# Patient Record
Sex: Male | Born: 1970 | Race: White | Hispanic: No | Marital: Married | Smoking: Never smoker
Health system: Southern US, Community
[De-identification: ages and names within clinical notes are randomized; demographics above are authoritative.]

## PROBLEM LIST (undated history)

## (undated) DIAGNOSIS — I10 Essential (primary) hypertension: Secondary | ICD-10-CM

## (undated) DIAGNOSIS — T7840XA Allergy, unspecified, initial encounter: Secondary | ICD-10-CM

## (undated) DIAGNOSIS — E785 Hyperlipidemia, unspecified: Secondary | ICD-10-CM

## (undated) DIAGNOSIS — K760 Fatty (change of) liver, not elsewhere classified: Secondary | ICD-10-CM

## (undated) HISTORY — DX: Essential (primary) hypertension: I10

## (undated) HISTORY — DX: Fatty (change of) liver, not elsewhere classified: K76.0

## (undated) HISTORY — DX: Allergy, unspecified, initial encounter: T78.40XA

## (undated) HISTORY — DX: Hyperlipidemia, unspecified: E78.5

---

## 2006-02-03 ENCOUNTER — Emergency Department (HOSPITAL_COMMUNITY): Admission: EM | Admit: 2006-02-03 | Discharge: 2006-02-03 | Payer: Self-pay | Admitting: Emergency Medicine

## 2012-10-01 ENCOUNTER — Other Ambulatory Visit: Payer: Self-pay | Admitting: Family Medicine

## 2012-10-01 ENCOUNTER — Other Ambulatory Visit (HOSPITAL_COMMUNITY): Payer: Self-pay | Admitting: Family Medicine

## 2012-10-01 DIAGNOSIS — R748 Abnormal levels of other serum enzymes: Secondary | ICD-10-CM

## 2012-10-03 ENCOUNTER — Other Ambulatory Visit (HOSPITAL_COMMUNITY): Payer: Self-pay

## 2012-10-05 ENCOUNTER — Ambulatory Visit (HOSPITAL_COMMUNITY): Payer: Self-pay

## 2012-10-05 ENCOUNTER — Ambulatory Visit (HOSPITAL_COMMUNITY)
Admission: RE | Admit: 2012-10-05 | Discharge: 2012-10-05 | Disposition: A | Discharge status: Home / Self Care | Payer: 59 | Source: Ambulatory Visit | Attending: Family Medicine | Admitting: Family Medicine

## 2012-10-05 DIAGNOSIS — R7989 Other specified abnormal findings of blood chemistry: Secondary | ICD-10-CM | POA: Insufficient documentation

## 2012-10-05 DIAGNOSIS — R748 Abnormal levels of other serum enzymes: Secondary | ICD-10-CM

## 2015-05-05 MED FILL — LOSARTAN POTASSIUM 50 MG TA: 50 | 90 days supply | Qty: 90 | Fill #0

## 2015-05-13 MED FILL — LEVOTHYROXINE 112 MCG TAB: 112 | 90 days supply | Qty: 90 | Fill #2

## 2015-07-01 DIAGNOSIS — J309 Allergic rhinitis, unspecified: Secondary | ICD-10-CM | POA: Diagnosis not present

## 2015-07-01 DIAGNOSIS — I10 Essential (primary) hypertension: Secondary | ICD-10-CM | POA: Diagnosis not present

## 2015-07-01 DIAGNOSIS — Z125 Encounter for screening for malignant neoplasm of prostate: Secondary | ICD-10-CM | POA: Diagnosis not present

## 2015-07-01 DIAGNOSIS — E782 Mixed hyperlipidemia: Secondary | ICD-10-CM | POA: Diagnosis not present

## 2015-07-01 DIAGNOSIS — E039 Hypothyroidism, unspecified: Secondary | ICD-10-CM | POA: Diagnosis not present

## 2015-07-01 MED FILL — LEVOTHYROXINE 125 MCG TAB: 125 | 90 days supply | Qty: 90 | Fill #0

## 2015-07-01 MED FILL — ATORVASTATIN 10 MG TABLET: 10 | 90 days supply | Qty: 90 | Fill #0

## 2015-07-15 MED FILL — FLUTICASONE PROP 50 MCG SPR: 50 | 60 days supply | Qty: 16 | Fill #0

## 2015-08-20 MED FILL — LOSARTAN POTASSIUM 50 MG TA: 50 | 90 days supply | Qty: 90 | Fill #0

## 2015-09-30 MED FILL — LEVOTHYROXINE 125 MCG TAB: 125 | 90 days supply | Qty: 90 | Fill #1

## 2015-09-30 MED FILL — ATORVASTATIN 10 MG TABLET: 10 | 90 days supply | Qty: 90 | Fill #1

## 2015-11-23 MED FILL — LOSARTAN POTASSIUM 50 MG TA: 50 | 90 days supply | Qty: 90 | Fill #1

## 2015-12-30 MED FILL — LEVOTHYROXINE 125 MCG TAB: 125 | 90 days supply | Qty: 90 | Fill #2

## 2015-12-30 MED FILL — ATORVASTATIN 10 MG TABLET: 10 | 90 days supply | Qty: 90 | Fill #2

## 2016-01-22 DIAGNOSIS — Z79899 Other long term (current) drug therapy: Secondary | ICD-10-CM | POA: Diagnosis not present

## 2016-01-22 DIAGNOSIS — E039 Hypothyroidism, unspecified: Secondary | ICD-10-CM | POA: Diagnosis not present

## 2016-01-22 DIAGNOSIS — E782 Mixed hyperlipidemia: Secondary | ICD-10-CM | POA: Diagnosis not present

## 2016-02-23 MED FILL — LOSARTAN POTASSIUM 50 MG TA: 50 | 90 days supply | Qty: 90 | Fill #2

## 2016-04-04 MED FILL — ATORVASTATIN 10 MG TABLET: 10 | 90 days supply | Qty: 90 | Fill #3

## 2016-04-04 MED FILL — LEVOTHYROXINE 125 MCG TABLE: 125 | 90 days supply | Qty: 90 | Fill #3

## 2016-05-27 MED FILL — LOSARTAN POTASSIUM 50 MG TA: 50 | 90 days supply | Qty: 90 | Fill #3

## 2016-07-06 MED FILL — LEVOTHYROXINE 125 MCG TABLE: 125 | 90 days supply | Qty: 90 | Fill #0

## 2016-07-06 MED FILL — ATORVASTATIN 10 MG TABLET: 10 | 90 days supply | Qty: 90 | Fill #0

## 2016-08-24 MED FILL — LOSARTAN POTASSIUM 50 MG TA: 50 | 90 days supply | Qty: 90 | Fill #0

## 2016-10-06 MED FILL — LEVOTHYROXINE 125 MCG TABLE: 125 | 90 days supply | Qty: 90 | Fill #1

## 2016-10-06 MED FILL — ATORVASTATIN 10 MG TABLET: 10 | 90 days supply | Qty: 90 | Fill #1

## 2016-11-22 MED FILL — LOSARTAN POTASSIUM 50 MG TA: 50 | 90 days supply | Qty: 90 | Fill #1

## 2017-01-05 DIAGNOSIS — J309 Allergic rhinitis, unspecified: Secondary | ICD-10-CM | POA: Diagnosis not present

## 2017-01-05 DIAGNOSIS — E782 Mixed hyperlipidemia: Secondary | ICD-10-CM | POA: Diagnosis not present

## 2017-01-05 DIAGNOSIS — Z23 Encounter for immunization: Secondary | ICD-10-CM | POA: Diagnosis not present

## 2017-01-05 DIAGNOSIS — E039 Hypothyroidism, unspecified: Secondary | ICD-10-CM | POA: Diagnosis not present

## 2017-01-05 DIAGNOSIS — Z125 Encounter for screening for malignant neoplasm of prostate: Secondary | ICD-10-CM | POA: Diagnosis not present

## 2017-01-05 DIAGNOSIS — I10 Essential (primary) hypertension: Secondary | ICD-10-CM | POA: Diagnosis not present

## 2017-01-05 DIAGNOSIS — R899 Unspecified abnormal finding in specimens from other organs, systems and tissues: Secondary | ICD-10-CM | POA: Diagnosis not present

## 2017-01-06 MED FILL — LEVOTHYROXINE 125 MCG TABLE: 125 | 90 days supply | Qty: 90 | Fill #0

## 2017-01-06 MED FILL — ATORVASTATIN 10 MG TABLET: 10 | 90 days supply | Qty: 90 | Fill #0

## 2017-01-30 MED FILL — LOSARTAN POTASSIUM 100 MG T: 100 | 90 days supply | Qty: 90 | Fill #0

## 2017-04-11 MED FILL — LEVOTHYROXINE 125 MCG TAB: 125 | 90 days supply | Qty: 90 | Fill #1

## 2017-04-11 MED FILL — ATORVASTATIN 10 MG TABLET: 10 | 90 days supply | Qty: 90 | Fill #1

## 2017-05-01 MED FILL — LOSARTAN POTASSIUM 100 MG T: 100 | 90 days supply | Qty: 90 | Fill #1

## 2017-07-08 ENCOUNTER — Ambulatory Visit: Payer: Self-pay | Admitting: Nurse Practitioner

## 2017-07-08 VITALS — BP 124/90 | HR 95 | Temp 98.8°F | Resp 20 | Wt 245.8 lb

## 2017-07-08 DIAGNOSIS — J309 Allergic rhinitis, unspecified: Secondary | ICD-10-CM

## 2017-07-08 DIAGNOSIS — J069 Acute upper respiratory infection, unspecified: Secondary | ICD-10-CM

## 2017-07-08 MED ORDER — BENZONATATE 100 MG PO CAPS: 100.0000 mg | ORAL_CAPSULE | Freq: Three times a day (TID) | ORAL | 0 refills | Status: AC | PRN

## 2017-07-08 MED ORDER — ALBUTEROL SULFATE HFA 108 (90 BASE) MCG/ACT IN AERS: 2.0000 | INHALATION_SPRAY | Freq: Four times a day (QID) | RESPIRATORY_TRACT | 0 refills | Status: DC | PRN

## 2017-07-08 NOTE — Patient Instructions (Signed)
Allergic Rhinitis, Adult Allergic rhinitis is an allergic reaction that affects the mucous membrane inside the nose. It causes sneezing, a runny or stuffy nose, and the feeling of mucus going down the back of the throat (postnasal drip). Allergic rhinitis can be mild to severe. There are two types of allergic rhinitis:  Seasonal. This type is also called hay fever. It happens only during certain seasons.  Perennial. This type can happen at any time of the year.  What are the causes? This condition happens when the body's defense system (immune system) responds to certain harmless substances called allergens as though they were germs.  Seasonal allergic rhinitis is triggered by pollen, which can come from grasses, trees, and weeds. Perennial allergic rhinitis may be caused by:  House dust mites.  Pet dander.  Mold spores.  What are the signs or symptoms? Symptoms of this condition include:  Sneezing.  Runny or stuffy nose (nasal congestion).  Postnasal drip.  Itchy nose.  Tearing of the eyes.  Trouble sleeping.  Daytime sleepiness.  How is this diagnosed? This condition may be diagnosed based on:  Your medical history.  A physical exam.  Tests to check for related conditions, such as: ? Asthma. ? Pink eye. ? Ear infection. ? Upper respiratory infection.  Tests to find out which allergens trigger your symptoms. These may include skin or blood tests.  How is this treated? There is no cure for this condition, but treatment can help control symptoms. Treatment may include:  Taking medicines that block allergy symptoms, such as antihistamines. Medicine may be given as a shot, nasal spray, or pill.  Avoiding the allergen.  Desensitization. This treatment involves getting ongoing shots until your body becomes less sensitive to the allergen. This treatment may be done if other treatments do not help.  If taking medicine and avoiding the allergen does not work, new,  stronger medicines may be prescribed.  Follow these instructions at home:  Find out what you are allergic to. Common allergens include smoke, dust, and pollen.  Avoid the things you are allergic to. These are some things you can do to help avoid allergens: ? Replace carpet with wood, tile, or vinyl flooring. Carpet can trap dander and dust. ? Do not smoke. Do not allow smoking in your home. ? Change your heating and air conditioning filter at least once a month. ? During allergy season:  Keep windows closed as much as possible.  Plan outdoor activities when pollen counts are lowest. This is usually during the evening hours.  When coming indoors, change clothing and shower before sitting on furniture or bedding.  Take over-the-counter and prescription medicines only as told by your health care provider.  Keep all follow-up visits as told by your health care provider. This is important. Contact a health care provider if:  You have a fever.  You develop a persistent cough.  You make whistling sounds when you breathe (you wheeze).  Your symptoms interfere with your normal daily activities. Get help right away if:  You have shortness of breath. Summary  This condition can be managed by taking medicines as directed and avoiding allergens.  Contact your health care provider if you develop a persistent cough or fever.  During allergy season, keep windows closed as much as possible. This information is not intended to replace advice given to you by your health care provider. Make sure you discuss any questions you have with your health care provider. Document Released: 12/07/2000 Document Revised: 04/21/2016  Document Reviewed: 04/21/2016 Elsevier Interactive Patient Education  2018 [employer].  Viral Respiratory Infection A respiratory infection is an illness that affects part of the respiratory system, such as the lungs, nose, or throat. Most respiratory infections are caused  by either viruses or bacteria. A respiratory infection that is caused by a virus is called a viral respiratory infection. Common types of viral respiratory infections include:  A cold.  The flu (influenza).  A respiratory syncytial virus (RSV) infection.  How do I know if I have a viral respiratory infection? Most viral respiratory infections cause:  A stuffy or runny nose.  Yellow or green nasal discharge.  A cough.  Sneezing.  Fatigue.  Achy muscles.  A sore throat.  Sweating or chills.  A fever.  A headache.  How are viral respiratory infections treated? If influenza is diagnosed early, it may be treated with an antiviral medicine that shortens the length of time a person has symptoms. Symptoms of viral respiratory infections may be treated with over-the-counter and prescription medicines, such as:  Expectorants. These make it easier to cough up mucus.  Decongestant nasal sprays.  Health care providers do not prescribe antibiotic medicines for viral infections. This is because antibiotics are designed to kill bacteria. They have no effect on viruses. How do I know if I should stay home from work or school? To avoid exposing others to your respiratory infection, stay home if you have:  A fever.  A persistent cough.  A sore throat.  A runny nose.  Sneezing.  Muscles aches.  Headaches.  Fatigue.  Weakness.  Chills.  Sweating.  Nausea.  Follow these instructions at home:  Rest as much as possible.  Take over-the-counter and prescription medicines only as told by your health care provider.  Drink enough fluid to keep your urine clear or pale yellow. This helps prevent dehydration and helps loosen up mucus.  Gargle with a salt-water mixture 3-4 times per day or as needed. To make a salt-water mixture, completely dissolve -1 tsp of salt in 1 cup of warm water.  Use nose drops made from salt water to ease congestion and soften raw skin around  your nose.  Do not drink alcohol.  Do not use tobacco products, including cigarettes, chewing tobacco, and e-cigarettes. If you need help quitting, ask your health care provider. Contact a health care provider if:  Your symptoms last for 10 days or longer.  Your symptoms get worse over time.  You have a fever.  You have severe sinus pain in your face or forehead.  The glands in your jaw or neck become very swollen. Get help right away if:  You feel pain or pressure in your chest.  You have shortness of breath.  You faint or feel like you will faint.  You have severe and persistent vomiting.  You feel confused or disoriented. This information is not intended to replace advice given to you by your health care provider. Make sure you discuss any questions you have with your health care provider. Document Released: 12/22/2004 Document Revised: 08/20/2015 Document Reviewed: 08/20/2014 Elsevier Interactive Patient Education  [employer].

## 2017-07-08 NOTE — Progress Notes (Addendum)
Subjective:  Brett Booth is a 47 y.o. male who presents for evaluation of URI like symptoms.  Symptoms include nasal congestion, no fever, post nasal drip and productive cough with yellow colored sputum.  Onset of symptoms was 2 days ago, and has been unchanged since that time.  Treatment to date:  antihistamines and naproxen.  High risk factors for influenza complications:  co-morbid illness.  The following portions of the patient's history were reviewed and updated as appropriate:  allergies, current medications and past medical history.  Constitutional: positive for fatigue, negative for anorexia, chills, fevers and sweats Eyes: negative Ears, nose, mouth, throat, and face: positive for scratchy throat and post-nasal drip, negative for ear drainage, earaches and sore throat Respiratory: positive for cough, dyspnea on exertion, sputum and wheezing, negative for asthma, chronic bronchitis, dyspnea on exertion, pneumonia and stridor Cardiovascular: negative Gastrointestinal: negative Neurological: negative Allergic/Immunologic: positive for hay fever Objective:  BP 124/90 (BP Location: Right Arm, Patient Position: Sitting, Cuff Size: Normal)   Pulse 95   Temp 98.8 F (37.1 C) (Oral)   Resp 20   Wt 245 lb 12.8 oz (111.5 kg)   SpO2 98%  General appearance: alert, cooperative, fatigued and no distress Head: Normocephalic, without obvious abnormality, atraumatic Eyes: conjunctivae/corneas clear. PERRL, EOM's intact. Fundi benign. Ears: normal TM's and external ear canals both ears Nose: no discharge, turbinates swollen, inflamed, no sinus tenderness Throat: lips, mucosa, and tongue normal; teeth and gums normal Lungs: clear to auscultation bilaterally Heart: regular rate and rhythm, S1, S2 normal, no murmur, click, rub or gallop Abdomen: soft, non-tender; bowel sounds normal; no masses,  no organomegaly Pulses: 2+ and symmetric Skin: Skin color, texture, turgor normal. No rashes or  lesions Lymph nodes: cervical and submandibular nodes normal Neurologic: Grossly normal    Assessment:  viral upper respiratory illness    Plan:  Discussed diagnosis and treatment of URI. Discussed the importance of avoiding unnecessary antibiotic therapy. Educational material distributed and questions answered. Suggested symptomatic OTC remedies. Supportive care with appropriate antipyretics and fluids. Nasal saline spray for congestion. Tessalon Perles per orders. Nasal steroids per orders. Follow up as needed.  Patient instructed to sleep on 2 pillows and to increase fluids.  Ibuprofen or Tylenol for pain fever or general discomfort.  Also discussed use of humidifier whether warm or cool humidified air would be helpful for cough at night.  Patient also advised to purchase over-the-counter Coricidin HBP for cough.  Patient verbalizes understanding and has no questions at time of discharge. Meds ordered this encounter  Medications  . benzonatate (TESSALON) 100 MG capsule    Sig: Take 1 capsule (100 mg total) by mouth 3 (three) times daily as needed for up to 10 days for cough.    Dispense:  30 capsule    Refill:  0    Order Specific Question:   Supervising Provider    Answer:   Ricard Dillon [[id number]]  . albuterol (PROVENTIL HFA;VENTOLIN HFA) 108 (90 Base) MCG/ACT inhaler    Sig: Inhale 2 puffs into the lungs every 6 (six) hours as needed for up to 10 days for wheezing or shortness of breath.    Dispense:  1 Inhaler    Refill:  0    Order Specific Question:   Supervising Provider    Answer:   Ricard Dillon [phone]

## 2017-07-11 MED FILL — LEVOTHYROXINE 125 MCG TAB: 125 | 90 days supply | Qty: 90 | Fill #2

## 2017-07-11 MED FILL — ATORVASTATIN 10 MG TABLET: 10 | 90 days supply | Qty: 90 | Fill #2

## 2017-07-19 ENCOUNTER — Telehealth: Payer: Self-pay

## 2017-07-31 MED FILL — LOSARTAN POTASSIUM 100 MG T: 100 | 90 days supply | Qty: 90 | Fill #2

## 2017-10-06 MED FILL — ATORVASTATIN 10 MG TABLET: 10 | 90 days supply | Qty: 90 | Fill #3

## 2017-10-06 MED FILL — LEVOTHYROXINE 125 MCG TAB: 125 | 90 days supply | Qty: 90 | Fill #3

## 2017-10-19 MED FILL — LOSARTAN POTASSIUM 100 MG T: 100 | 90 days supply | Qty: 90 | Fill #3

## 2017-11-15 MED FILL — FLUTICASONE PROP 50 MCG SPR: 50 | 30 days supply | Qty: 16 | Fill #0

## 2017-12-21 MED FILL — FLUTICASONE PROP 50 MCG SPR: 50 | 30 days supply | Qty: 16 | Fill #1

## 2018-01-04 MED FILL — LEVOTHYROXINE 125 MCG TAB: 125 | 90 days supply | Qty: 90 | Fill #4

## 2018-01-04 MED FILL — ATORVASTATIN 10 MG TABLET: 10 | 90 days supply | Qty: 90 | Fill #4

## 2018-01-04 MED FILL — LOSARTAN POTASSIUM 100 MG T: 100 | 90 days supply | Qty: 90 | Fill #4

## 2018-04-11 DIAGNOSIS — Z125 Encounter for screening for malignant neoplasm of prostate: Secondary | ICD-10-CM | POA: Diagnosis not present

## 2018-04-11 DIAGNOSIS — I10 Essential (primary) hypertension: Secondary | ICD-10-CM | POA: Diagnosis not present

## 2018-04-11 DIAGNOSIS — K760 Fatty (change of) liver, not elsewhere classified: Secondary | ICD-10-CM | POA: Diagnosis not present

## 2018-04-11 DIAGNOSIS — Z0184 Encounter for antibody response examination: Secondary | ICD-10-CM | POA: Diagnosis not present

## 2018-04-11 DIAGNOSIS — E782 Mixed hyperlipidemia: Secondary | ICD-10-CM | POA: Diagnosis not present

## 2018-04-11 DIAGNOSIS — Z6836 Body mass index (BMI) 36.0-36.9, adult: Secondary | ICD-10-CM | POA: Diagnosis not present

## 2018-04-11 DIAGNOSIS — E039 Hypothyroidism, unspecified: Secondary | ICD-10-CM | POA: Diagnosis not present

## 2018-04-11 MED FILL — AMLODIPINE BESYLATE 5 MG TA: 5 | 90 days supply | Qty: 90 | Fill #0

## 2018-04-12 MED FILL — ATORVASTATIN 10 MG TABLET: 10 | 90 days supply | Qty: 90 | Fill #0

## 2018-04-12 MED FILL — LEVOTHYROXINE 125 MCG TAB: 125 | 90 days supply | Qty: 90 | Fill #0

## 2018-04-12 MED FILL — LOSARTAN POTASSIUM 100 MG T: 100 | 90 days supply | Qty: 90 | Fill #0

## 2018-06-08 MED FILL — FLUTICASONE PROP 50 MCG SPR: 50 | 30 days supply | Qty: 16 | Fill #0

## 2018-06-14 MED FILL — ATORVASTATIN 10 MG TABLET: 10 | 90 days supply | Qty: 90 | Fill #1

## 2018-06-14 MED FILL — LEVOTHYROXINE 125 MCG TAB: 125 | 90 days supply | Qty: 90 | Fill #1

## 2018-06-14 MED FILL — AMLODIPINE BESYLATE 5 MG TA: 5 | 90 days supply | Qty: 90 | Fill #1

## 2018-06-14 MED FILL — LOSARTAN POTASSIUM 100 MG T: 100 | 90 days supply | Qty: 90 | Fill #1

## 2018-08-30 MED FILL — FLUTICASONE PROP 50 MCG SPR: 50 | 30 days supply | Qty: 16 | Fill #1

## 2018-10-12 MED FILL — ATORVASTATIN 10 MG TABLET: 10 | 90 days supply | Qty: 90 | Fill #2

## 2018-10-12 MED FILL — LOSARTAN POTASSIUM 100 MG T: 100 | 90 days supply | Qty: 90 | Fill #2

## 2018-10-12 MED FILL — LEVOTHYROXINE 125 MCG TAB: 125 | 90 days supply | Qty: 90 | Fill #2

## 2018-10-12 MED FILL — AMLODIPINE BESYLATE 5 MG TA: 5 | 90 days supply | Qty: 90 | Fill #2

## 2019-01-11 MED FILL — LOSARTAN POTASSIUM 100 MG T: 100 | 90 days supply | Qty: 90 | Fill #3

## 2019-01-11 MED FILL — AMLODIPINE BESYLATE 5 MG TA: 5 | 90 days supply | Qty: 90 | Fill #3

## 2019-01-11 MED FILL — LEVOTHYROXINE 125 MCG TAB: 125 | 90 days supply | Qty: 90 | Fill #3

## 2019-01-11 MED FILL — ATORVASTATIN 10 MG TABLET: 10 | 90 days supply | Qty: 90 | Fill #3

## 2019-04-10 MED FILL — LEVOTHYROXINE SODIUM 125 MC: 125 | 90 days supply | Qty: 90 | Fill #4

## 2019-04-10 MED FILL — ATORVASTATIN 10 MG TABLET: 10 | 90 days supply | Qty: 90 | Fill #4

## 2019-04-10 MED FILL — LOSARTAN POTASSIUM 100 MG T: 100 | 90 days supply | Qty: 90 | Fill #4

## 2019-04-10 MED FILL — AMLODIPINE BESYLATE 5 MG TA: 5 | 30 days supply | Qty: 30 | Fill #4

## 2019-04-24 DIAGNOSIS — E782 Mixed hyperlipidemia: Secondary | ICD-10-CM | POA: Diagnosis not present

## 2019-04-24 DIAGNOSIS — I10 Essential (primary) hypertension: Secondary | ICD-10-CM | POA: Diagnosis not present

## 2019-04-24 DIAGNOSIS — E039 Hypothyroidism, unspecified: Secondary | ICD-10-CM | POA: Diagnosis not present

## 2019-04-24 DIAGNOSIS — K760 Fatty (change of) liver, not elsewhere classified: Secondary | ICD-10-CM | POA: Diagnosis not present

## 2019-04-24 DIAGNOSIS — Z6835 Body mass index (BMI) 35.0-35.9, adult: Secondary | ICD-10-CM | POA: Diagnosis not present

## 2019-04-24 DIAGNOSIS — Z125 Encounter for screening for malignant neoplasm of prostate: Secondary | ICD-10-CM | POA: Diagnosis not present

## 2019-04-24 DIAGNOSIS — R7301 Impaired fasting glucose: Secondary | ICD-10-CM | POA: Diagnosis not present

## 2019-04-24 DIAGNOSIS — Z1211 Encounter for screening for malignant neoplasm of colon: Secondary | ICD-10-CM | POA: Diagnosis not present

## 2019-05-06 MED FILL — AMLODIPINE BESYLATE 5 MG TA: 5 | 30 days supply | Qty: 30 | Fill #0

## 2019-05-07 NOTE — Telephone Encounter (Signed)
Error

## 2019-06-04 MED FILL — FLUTICASONE PROP 50 MCG SPR: 50 | 30 days supply | Qty: 16 | Fill #0

## 2019-06-10 MED FILL — AMLODIPINE BESYLATE 5 MG TA: 5 | 30 days supply | Qty: 30 | Fill #1

## 2019-06-21 DIAGNOSIS — R7301 Impaired fasting glucose: Secondary | ICD-10-CM | POA: Diagnosis not present

## 2019-06-21 DIAGNOSIS — Z125 Encounter for screening for malignant neoplasm of prostate: Secondary | ICD-10-CM | POA: Diagnosis not present

## 2019-06-21 DIAGNOSIS — E039 Hypothyroidism, unspecified: Secondary | ICD-10-CM | POA: Diagnosis not present

## 2019-06-21 DIAGNOSIS — E782 Mixed hyperlipidemia: Secondary | ICD-10-CM | POA: Diagnosis not present

## 2019-06-21 DIAGNOSIS — K760 Fatty (change of) liver, not elsewhere classified: Secondary | ICD-10-CM | POA: Diagnosis not present

## 2019-06-21 DIAGNOSIS — Z1211 Encounter for screening for malignant neoplasm of colon: Secondary | ICD-10-CM | POA: Diagnosis not present

## 2019-06-21 DIAGNOSIS — I10 Essential (primary) hypertension: Secondary | ICD-10-CM | POA: Diagnosis not present

## 2019-07-10 ENCOUNTER — Other Ambulatory Visit (HOSPITAL_COMMUNITY): Payer: Self-pay | Admitting: Family Medicine

## 2019-07-10 MED FILL — LEVOTHYROXINE SODIUM 125 MC: 125 | 90 days supply | Qty: 90 | Fill #0

## 2019-07-10 MED FILL — ATORVASTATIN 10 MG TABLET: 10 | 90 days supply | Qty: 90 | Fill #0

## 2019-07-10 MED FILL — LOSARTAN POTASSIUM 100 MG T: 100 | 90 days supply | Qty: 90 | Fill #0

## 2019-07-10 MED FILL — AMLODIPINE BESYLATE 5 MG TA: 5 | 30 days supply | Qty: 30 | Fill #2

## 2019-07-10 MED FILL — FLUTICASONE PROP 50 MCG SPR: 50 | 30 days supply | Qty: 16 | Fill #0

## 2019-07-11 DIAGNOSIS — E782 Mixed hyperlipidemia: Secondary | ICD-10-CM | POA: Diagnosis not present

## 2019-07-11 DIAGNOSIS — Z0000 Encounter for general adult medical examination without abnormal findings: Secondary | ICD-10-CM | POA: Diagnosis not present

## 2019-07-11 DIAGNOSIS — R7301 Impaired fasting glucose: Secondary | ICD-10-CM | POA: Diagnosis not present

## 2019-07-11 DIAGNOSIS — I10 Essential (primary) hypertension: Secondary | ICD-10-CM | POA: Diagnosis not present

## 2019-07-11 DIAGNOSIS — J309 Allergic rhinitis, unspecified: Secondary | ICD-10-CM | POA: Diagnosis not present

## 2019-07-11 DIAGNOSIS — Z1211 Encounter for screening for malignant neoplasm of colon: Secondary | ICD-10-CM | POA: Diagnosis not present

## 2019-07-11 DIAGNOSIS — E039 Hypothyroidism, unspecified: Secondary | ICD-10-CM | POA: Diagnosis not present

## 2019-07-11 DIAGNOSIS — K760 Fatty (change of) liver, not elsewhere classified: Secondary | ICD-10-CM | POA: Diagnosis not present

## 2019-07-12 ENCOUNTER — Other Ambulatory Visit: Payer: Self-pay | Admitting: Family Medicine

## 2019-07-12 DIAGNOSIS — K760 Fatty (change of) liver, not elsewhere classified: Secondary | ICD-10-CM

## 2019-08-02 ENCOUNTER — Ambulatory Visit
Admission: RE | Admit: 2019-08-02 | Discharge: 2019-08-02 | Disposition: A | Discharge status: Home / Self Care | Payer: 59 | Source: Ambulatory Visit | Attending: Family Medicine | Admitting: Family Medicine

## 2019-08-02 DIAGNOSIS — K760 Fatty (change of) liver, not elsewhere classified: Secondary | ICD-10-CM

## 2019-08-12 MED FILL — AMLODIPINE BESYLATE 5 MG TA: 5 | 30 days supply | Qty: 30 | Fill #0

## 2019-09-10 MED FILL — AMLODIPINE BESYLATE 5 MG TA: 5 | 30 days supply | Qty: 30 | Fill #0

## 2019-09-10 MED FILL — FLUTICASONE PROP 50 MCG SPR: 50 | 30 days supply | Qty: 16 | Fill #0

## 2019-10-04 MED FILL — AMLODIPINE BESYLATE 5 MG TA: 5 | 30 days supply | Qty: 30 | Fill #0

## 2019-10-14 MED FILL — LOSARTAN POTASSIUM 100 MG T: 100 | 90 days supply | Qty: 90 | Fill #1

## 2019-10-14 MED FILL — ATORVASTATIN CALCIUM 10 MG: 10 | 90 days supply | Qty: 90 | Fill #1

## 2019-10-14 MED FILL — LEVOTHYROXINE SODIUM 125 MC: 125 | 90 days supply | Qty: 90 | Fill #1

## 2019-10-16 ENCOUNTER — Other Ambulatory Visit (HOSPITAL_COMMUNITY): Payer: Self-pay | Admitting: Family Medicine

## 2019-10-16 MED FILL — AMLODIPINE BESYLATE 5 MG TA: 5 | 90 days supply | Qty: 90 | Fill #0

## 2020-01-07 MED FILL — LOSARTAN POTASSIUM 100 MG T: 100 | 90 days supply | Qty: 90 | Fill #2

## 2020-01-07 MED FILL — LEVOTHYROXINE SODIUM 125 MC: 125 | 90 days supply | Qty: 90 | Fill #2

## 2020-01-07 MED FILL — AMLODIPINE BESYLATE 5 MG TA: 5 | 90 days supply | Qty: 90 | Fill #1

## 2020-01-07 MED FILL — ATORVASTATIN CALCIUM 10 MG: 10 | 90 days supply | Qty: 90 | Fill #2

## 2020-03-05 ENCOUNTER — Other Ambulatory Visit (HOSPITAL_COMMUNITY): Payer: Self-pay | Admitting: Family Medicine

## 2020-03-05 MED FILL — FLUTICASONE PROP 50 MCG SPR: 50 | 30 days supply | Qty: 16 | Fill #0

## 2020-03-06 DIAGNOSIS — K760 Fatty (change of) liver, not elsewhere classified: Secondary | ICD-10-CM | POA: Diagnosis not present

## 2020-04-13 MED FILL — LOSARTAN POTASSIUM 100 MG T: 100 | 30 days supply | Qty: 30 | Fill #3

## 2020-04-13 MED FILL — ATORVASTATIN CALCIUM 10 MG: 10 | 90 days supply | Qty: 90 | Fill #3

## 2020-04-13 MED FILL — AMLODIPINE BESYLATE 5 MG TA: 5 | 90 days supply | Qty: 90 | Fill #2

## 2020-04-13 MED FILL — LEVOTHYROXINE SODIUM 125 MC: 125 | 90 days supply | Qty: 90 | Fill #3

## 2020-05-25 MED FILL — LOSARTAN POTASSIUM 100 MG T: 100 | 30 days supply | Qty: 30 | Fill #4

## 2020-06-29 ENCOUNTER — Other Ambulatory Visit: Payer: Self-pay

## 2020-06-30 ENCOUNTER — Other Ambulatory Visit (HOSPITAL_COMMUNITY): Payer: Self-pay

## 2020-06-30 MED FILL — Losartan Potassium Tab 100 MG: ORAL | 30 days supply | Qty: 30 | Fill #0 | Status: AC

## 2020-07-03 ENCOUNTER — Other Ambulatory Visit (HOSPITAL_BASED_OUTPATIENT_CLINIC_OR_DEPARTMENT_OTHER)
Admission: RE | Admit: 2020-07-03 | Discharge: 2020-07-03 | Disposition: A | Discharge status: Home / Self Care | Payer: 59 | Source: Ambulatory Visit | Attending: Family Medicine | Admitting: Family Medicine

## 2020-07-03 ENCOUNTER — Other Ambulatory Visit (HOSPITAL_COMMUNITY): Payer: Self-pay

## 2020-07-03 ENCOUNTER — Ambulatory Visit (HOSPITAL_BASED_OUTPATIENT_CLINIC_OR_DEPARTMENT_OTHER): Payer: 59 | Admitting: Family Medicine

## 2020-07-03 ENCOUNTER — Other Ambulatory Visit: Payer: Self-pay

## 2020-07-03 ENCOUNTER — Encounter (HOSPITAL_BASED_OUTPATIENT_CLINIC_OR_DEPARTMENT_OTHER): Payer: Self-pay | Admitting: Family Medicine

## 2020-07-03 ENCOUNTER — Other Ambulatory Visit (HOSPITAL_BASED_OUTPATIENT_CLINIC_OR_DEPARTMENT_OTHER): Payer: Self-pay

## 2020-07-03 ENCOUNTER — Telehealth (HOSPITAL_BASED_OUTPATIENT_CLINIC_OR_DEPARTMENT_OTHER): Payer: Self-pay | Admitting: Family Medicine

## 2020-07-03 DIAGNOSIS — J302 Other seasonal allergic rhinitis: Secondary | ICD-10-CM | POA: Diagnosis not present

## 2020-07-03 DIAGNOSIS — I10 Essential (primary) hypertension: Secondary | ICD-10-CM | POA: Insufficient documentation

## 2020-07-03 DIAGNOSIS — E039 Hypothyroidism, unspecified: Secondary | ICD-10-CM | POA: Diagnosis not present

## 2020-07-03 DIAGNOSIS — E785 Hyperlipidemia, unspecified: Secondary | ICD-10-CM | POA: Diagnosis not present

## 2020-07-03 DIAGNOSIS — Z0000 Encounter for general adult medical examination without abnormal findings: Secondary | ICD-10-CM | POA: Diagnosis not present

## 2020-07-03 DIAGNOSIS — E669 Obesity, unspecified: Secondary | ICD-10-CM

## 2020-07-03 LAB — COMPREHENSIVE METABOLIC PANEL
ALT: 36 U/L (ref 0–44)
AST: 28 U/L (ref 15–41)
Albumin: 4.9 g/dL (ref 3.5–5.0)
Alkaline Phosphatase: 43 U/L (ref 38–126)
Anion gap: 11 (ref 5–15)
BUN: 14 mg/dL (ref 6–20)
CO2: 24 mmol/L (ref 22–32)
Calcium: 10.2 mg/dL (ref 8.9–10.3)
Chloride: 104 mmol/L (ref 98–111)
Creatinine, Ser: 1.07 mg/dL (ref 0.61–1.24)
GFR, Estimated: >60 mL/min (ref >60)
Glucose, Bld: 98 mg/dL (ref 70–99)
Potassium: 4 mmol/L (ref 3.5–5.1)
Sodium: 139 mmol/L (ref 135–145)
Total Bilirubin: 1.1 mg/dL (ref 0.3–1.2)
Total Protein: 8 g/dL (ref 6.5–8.1)

## 2020-07-03 LAB — CBC WITH DIFFERENTIAL/PLATELET
Abs Immature Granulocytes: 0.03 10*3/uL (ref 0.00–0.07)
Basophils Absolute: 0.1 10*3/uL (ref 0.0–0.1)
Basophils Relative: 1 %
Eosinophils Absolute: 0.1 10*3/uL (ref 0.0–0.5)
Eosinophils Relative: 1 %
HCT: 48.3 % (ref 39.0–52.0)
Hemoglobin: 16.7 g/dL (ref 13.0–17.0)
Immature Granulocytes: 0 %
Lymphocytes Relative: 30 %
Lymphs Abs: 2.1 10*3/uL (ref 0.7–4.0)
MCH: 33.8 pg (ref 26.0–34.0)
MCHC: 34.6 g/dL (ref 30.0–36.0)
MCV: 97.8 fL (ref 80.0–100.0)
Monocytes Absolute: 0.8 10*3/uL (ref 0.1–1.0)
Monocytes Relative: 11 %
Neutro Abs: 4.1 10*3/uL (ref 1.7–7.7)
Neutrophils Relative %: 57 %
Platelets: 250 10*3/uL (ref 150–400)
RBC: 4.94 MIL/uL (ref 4.22–5.81)
RDW: 12.4 % (ref 11.5–15.5)
WBC: 7.2 10*3/uL (ref 4.0–10.5)
nRBC: 0 % (ref 0.0–0.2)

## 2020-07-03 LAB — LIPID PANEL
Cholesterol: 186 mg/dL (ref 0–200)
HDL: 56 mg/dL (ref >40)
LDL Cholesterol: 113 mg/dL — ABNORMAL HIGH (ref 0–99)
Total CHOL/HDL Ratio: 3.3 RATIO
Triglycerides: 85 mg/dL (ref <150)
VLDL: 17 mg/dL (ref 0–40)

## 2020-07-03 LAB — TSH: TSH: 14.407 u[IU]/mL — ABNORMAL HIGH (ref 0.350–4.500)

## 2020-07-03 MED ORDER — FLUTICASONE PROPIONATE 50 MCG/ACT NA SUSP
NASAL | 0 refills | Status: DC
  Filled 2020-07-03 (×2): qty 16, 30d supply, fill #0

## 2020-07-03 MED ORDER — LOSARTAN POTASSIUM 100 MG PO TABS
100.0000 mg | ORAL_TABLET | Freq: Every day | ORAL | 3 refills | Status: DC
  Filled 2020-07-03 – 2020-07-23 (×2): qty 90, 90d supply, fill #0
  Filled 2020-10-05: qty 90, 90d supply, fill #1
  Filled 2021-01-06: qty 90, 90d supply, fill #2
  Filled 2021-04-11: qty 90, 90d supply, fill #3

## 2020-07-03 MED ORDER — AMLODIPINE BESYLATE 5 MG PO TABS
5.0000 mg | ORAL_TABLET | Freq: Every day | ORAL | 2 refills | Status: DC
  Filled 2020-07-03 (×2): qty 90, 90d supply, fill #0
  Filled 2020-10-05: qty 90, 90d supply, fill #1
  Filled 2021-01-06: qty 90, 90d supply, fill #2

## 2020-07-03 MED ORDER — ATORVASTATIN CALCIUM 10 MG PO TABS
10.0000 mg | ORAL_TABLET | Freq: Every day | ORAL | 3 refills | Status: DC
  Filled 2020-07-03 (×2): qty 90, 90d supply, fill #0
  Filled 2020-10-05: qty 90, 90d supply, fill #1
  Filled 2021-01-06: qty 90, 90d supply, fill #2
  Filled 2021-04-11: qty 90, 90d supply, fill #3

## 2020-07-03 MED ORDER — LEVOTHYROXINE SODIUM 125 MCG PO TABS
125.0000 ug | ORAL_TABLET | Freq: Every day | ORAL | 3 refills | Status: DC
  Filled 2020-07-03 (×2): qty 90, 90d supply, fill #0

## 2020-07-03 NOTE — Patient Instructions (Signed)
  Medication Instructions:  Your physician recommends that you continue on your current medications as directed. Please refer to the Current Medication list given to you today. --If you need a refill on any your medications before your next appointment, please call your pharmacy first. If no refills are authorized on file call the office.--  Lab Work: Your physician has recommended that you have lab work today: CBC, Comprehensive Metabolic Panel, Lipid Panel, and Thyroid Panel If you have labs (blood work) drawn today and your tests are completely normal, you will receive your results only by: Marland Kitchen MyChart Message (if you have MyChart) OR . A phone call from our staff. Please ensure you check your voicemail in the event that you authorized detailed messages to be left on a delegated number. If you have any lab test that is abnormal or we need to change your treatment, we will call you to review the results.  Follow-Up: Your next appointment:   Your physician recommends that you schedule a follow-up appointment in: 6 MONTHS with Dr. De [place]  Thanks for letting us be apart of your health journey!!  Primary Care and Sports Medicine   Dr. de [place] and Worthy Keeler, DNP, AGNP  We recommend signing up for the patient portal called "MyChart".  Sign up information is provided on this After Visit Summary.  MyChart is used to connect with patients for Virtual Visits (Telemedicine).  Patients are able to view lab/test results, encounter notes, upcoming appointments, etc.  Non-urgent messages can be sent to your provider as well.   To learn more about what you can do with MyChart, go to [email or web address].

## 2020-07-03 NOTE — Assessment & Plan Note (Signed)
Chronic, at goal per home readings, borderline in office today Recommend continuing with current dosages of amlodipine and losartan Patient has had intentional weight loss over the past year which is helpful in regards to lowering his BMI, encouraged to continue with lifestyle modifications, avoiding extra salt in the diet Continue to monitor blood pressures at home Consider adjustments in medication if having symptomatic low blood pressure, particularly if patient continues to lose weight Check labs as below

## 2020-07-03 NOTE — Assessment & Plan Note (Signed)
Chronic, at goal No overt symptoms of hypo or hyperthyroidism Continue with current dosage of medication We will check TSH and adjust dose as needed

## 2020-07-03 NOTE — Progress Notes (Signed)
New Patient Office Visit  Subjective:  Patient ID: Brett Booth, male    DOB: 12/10/1970  Age: 50 y.o. MRN: [id number]  CC:  Chief Complaint  Patient presents with  . Establish Care  . Medication Refill    HPI Brett Booth is an [age] year old male presenting to establish in clinic.  No specific concerns today.  Reports past medical history of hypertension, hypothyroidism, hyperlipidemia, seasonal allergies.  Hypertension: Reports he does check his blood pressure at home somewhat regularly, blood pressure typically 191Y systolic and 78G diastolic.  Denies any issues with chest pain, frequent headaches, shortness of breath.  Will occasionally have some dizziness at times.  Has not checked blood pressure during these episodes of dizziness.  Currently taking amlodipine and losartan, reports being on his medications for at least a few years now.  Hypothyroidism presently taking 125 mcg dose.  No recent dose adjustments for this.  Denies any issues related to temperature intolerance, palpitations.  Hyperlipidemia: Being managed on atorvastatin, denies any myalgias, change in urine color.  Needing refill of atorvastatin today.  Seasonal allergies: Managed with the use of intranasal steroid spray, requesting refill of this.  Also uses generic Zyrtec orally to help control symptoms.  Past Medical History:  Diagnosis Date  . Allergy   . Fatty liver   . Hyperlipidemia   . Hypertension     History reviewed. No pertinent surgical history.  Family History  Problem Relation Age of Onset  . Diabetes Mother   . Hypothyroidism Mother   . Hypertension Mother   . Hypertension Father   . Diabetes Father   . Atrial fibrillation Father   . Hypertension Maternal Grandmother   . Renal Disease Paternal Grandmother     Social History   Socioeconomic History  . Marital status: Married    Spouse name: Not on file  . Number of children: Not on file  . Years of education: Not on file  .  Highest education level: Not on file  Occupational History  . Not on file  Tobacco Use  . Smoking status: Never Smoker  . Smokeless tobacco: Never Used  Vaping Use  . Vaping Use: Never used  Substance and Sexual Activity  . Alcohol use: Yes    Comment: socailly  . Drug use: Never  . Sexual activity: Yes    Birth control/protection: None  Other Topics Concern  . Not on file  Social History Narrative  . Not on file   Social Determinants of Health   Financial Resource Strain: Not on file  Food Insecurity: Not on file  Transportation Needs: Not on file  Physical Activity: Not on file  Stress: Not on file  Social Connections: Not on file  Intimate Partner Violence: Not on file    Objective:   Today's Vitals: BP (!) 130/98   Pulse 66   Ht 5' 10"  (1.778 m)   Wt 224 lb 6.4 oz (101.8 kg)   SpO2 97%   BMI 32.20 kg/m   Physical Exam  Pleasant 51 year old male in no acute distress; blood pressure recheck with slight improvement to 122/88 Cardiovascular exam with regular rate and rhythm, no murmurs appreciated Lungs clear to auscultation bilaterally  Assessment & Plan:   Problem List Items Addressed This Visit      Cardiovascular and Mediastinum   Hypertension    Chronic, at goal per home readings, borderline in office today Recommend continuing with current dosages of amlodipine and losartan Patient has had intentional  weight loss over the past year which is helpful in regards to lowering his BMI, encouraged to continue with lifestyle modifications, avoiding extra salt in the diet Continue to monitor blood pressures at home Consider adjustments in medication if having symptomatic low blood pressure, particularly if patient continues to lose weight Check labs as below      Relevant Medications   amLODipine (NORVASC) 5 MG tablet   atorvastatin (LIPITOR) 10 MG tablet   losartan (COZAAR) 100 MG tablet   Other Relevant Orders   CBC with Differential/Platelet  (Completed)   Comprehensive metabolic panel   Lipid panel     Endocrine   Hypothyroidism    Chronic, at goal No overt symptoms of hypo or hyperthyroidism Continue with current dosage of medication We will check TSH and adjust dose as needed      Relevant Medications   levothyroxine (SYNTHROID) 125 MCG tablet   Other Relevant Orders   TSH     Other   Hyperlipidemia    Chronic Continue with current dosage of atorvastatin Continue with lifestyle modifications, working on gradual weight loss to lower BMI We will check lipid panel      Relevant Medications   amLODipine (NORVASC) 5 MG tablet   atorvastatin (LIPITOR) 10 MG tablet   losartan (COZAAR) 100 MG tablet   Other Relevant Orders   CBC with Differential/Platelet (Completed)   Comprehensive metabolic panel   Lipid panel   Seasonal allergies    Controlled with use of intranasal steroid spray, will refill this today      Relevant Medications   fluticasone (FLONASE) 50 MCG/ACT nasal spray   Obesity (BMI 30.0-34.9)    Has had intentional, gradual weight loss over the past year Provided encouragement regarding this and potential benefits with continuing to gradually lower BMI Patient primarily engaging in healthy diet, exercise including brisk walking, golf Monitor weight at future visits       Discussed with patient risks and benefits of prostate cancer screening with PSA measurement including potential benefits of early detection of prostate cancer and weighing that against risks associated with invasive diagnostic testing if PSA found to be abnormal.  At this time, patient decided to hold off on recheck of PSA, may consider checking again in the future.  Outpatient Encounter Medications as of 07/03/2020  Medication Sig  . cetirizine (ZYRTEC) 10 MG tablet Take 10 mg by mouth daily.  . [DISCONTINUED] amLODipine (NORVASC) 5 MG tablet TAKE 1 TABLET BY MOUTH ONCE DAILY.  . [DISCONTINUED] atorvastatin (LIPITOR) 10 MG tablet  TAKE 1 TABLET BY MOUTH ONCE DAILY  . [DISCONTINUED] fluticasone (FLONASE) 50 MCG/ACT nasal spray PLACE 2 SPRAYS INTO EACH NOSTRIL ONCE DAILY.  . [DISCONTINUED] levothyroxine (SYNTHROID) 125 MCG tablet TAKE 1 TABLET BY MOUTH ONCE DAILY ON AN EMPTY STOMACH  . [DISCONTINUED] losartan (COZAAR) 100 MG tablet TAKE 1 TABLET BY MOUTH ONCE DAILY  . amLODipine (NORVASC) 5 MG tablet TAKE 1 TABLET BY MOUTH ONCE DAILY.  Marland Kitchen atorvastatin (LIPITOR) 10 MG tablet TAKE 1 TABLET BY MOUTH ONCE DAILY  . fluticasone (FLONASE) 50 MCG/ACT nasal spray PLACE 2 SPRAYS INTO EACH NOSTRIL ONCE DAILY.  Marland Kitchen levothyroxine (SYNTHROID) 125 MCG tablet TAKE 1 TABLET BY MOUTH ONCE DAILY ON AN EMPTY STOMACH  . losartan (COZAAR) 100 MG tablet TAKE 1 TABLET BY MOUTH ONCE DAILY  . [DISCONTINUED] albuterol (PROVENTIL HFA;VENTOLIN HFA) 108 (90 Base) MCG/ACT inhaler Inhale 2 puffs into the lungs every 6 (six) hours as needed for up to 10 days for  wheezing or shortness of breath.  . [DISCONTINUED] atorvastatin (LIPITOR) 10 MG tablet Take 10 mg by mouth daily.  . [DISCONTINUED] cetirizine (ZYRTEC) 10 MG chewable tablet Chew 10 mg by mouth daily. (Patient not taking: Reported on 07/03/2020)  . [DISCONTINUED] levothyroxine (SYNTHROID, LEVOTHROID) 125 MCG tablet Take 125 mcg by mouth daily before breakfast.  . [DISCONTINUED] losartan (COZAAR) 25 MG tablet Take 25 mg by mouth daily.   No facility-administered encounter medications on file as of 07/03/2020.   Spent 60 minutes on this patient encounter, including preparation, chart review, face-to-face counseling with patient and coordination of care, and documentation of encounter  Follow-up: Return in about 6 months (around 01/02/2021) for Follow Up.   Lekisha Mcghee J De [place], MD

## 2020-07-03 NOTE — Telephone Encounter (Signed)
Spoke w/ Pt and rescheduled for 10/12 @ 8:10a - [facility]

## 2020-07-03 NOTE — Assessment & Plan Note (Signed)
Chronic Continue with current dosage of atorvastatin Continue with lifestyle modifications, working on gradual weight loss to lower BMI We will check lipid panel

## 2020-07-03 NOTE — Assessment & Plan Note (Signed)
Has had intentional, gradual weight loss over the past year Provided encouragement regarding this and potential benefits with continuing to gradually lower BMI Patient primarily engaging in healthy diet, exercise including brisk walking, golf Monitor weight at future visits

## 2020-07-03 NOTE — Assessment & Plan Note (Signed)
Controlled with use of intranasal steroid spray, will refill this today

## 2020-07-06 ENCOUNTER — Other Ambulatory Visit (HOSPITAL_COMMUNITY): Payer: Self-pay

## 2020-07-06 ENCOUNTER — Other Ambulatory Visit (HOSPITAL_BASED_OUTPATIENT_CLINIC_OR_DEPARTMENT_OTHER): Payer: Self-pay | Admitting: Family Medicine

## 2020-07-06 ENCOUNTER — Encounter (HOSPITAL_BASED_OUTPATIENT_CLINIC_OR_DEPARTMENT_OTHER): Payer: Self-pay | Admitting: Family Medicine

## 2020-07-06 DIAGNOSIS — E039 Hypothyroidism, unspecified: Secondary | ICD-10-CM

## 2020-07-06 MED ORDER — LEVOTHYROXINE SODIUM 175 MCG PO TABS
175.0000 ug | ORAL_TABLET | Freq: Every day | ORAL | 1 refills | Status: DC
  Filled 2020-07-06: qty 90, 90d supply, fill #0
  Filled 2020-10-05: qty 7, 7d supply, fill #1
  Filled 2020-10-05: qty 90, 90d supply, fill #1
  Filled 2020-10-07: qty 83, 83d supply, fill #2

## 2020-07-07 ENCOUNTER — Encounter (HOSPITAL_BASED_OUTPATIENT_CLINIC_OR_DEPARTMENT_OTHER): Payer: Self-pay

## 2020-07-07 ENCOUNTER — Telehealth (HOSPITAL_BASED_OUTPATIENT_CLINIC_OR_DEPARTMENT_OTHER): Payer: Self-pay

## 2020-07-07 NOTE — Telephone Encounter (Signed)
Results released by Dr. de [place] and reviewed by patient via [place] patient to contact the office with any questions or concerns. New RX for up titrated dose of Levothyroxine sent to [facility]

## 2020-07-07 NOTE — Telephone Encounter (Signed)
-----   Message from Raymond J de [place], MD sent at 07/06/2020 10:13 AM EDT ----- Attempted to call patient to review lab results.  Labs show normal white blood cell and red blood cell counts.  Normal electrolytes, kidney function, liver function.  Slightly elevated "bad" cholesterol with normal total cholesterol.  TSH is notably elevated at 14.4 -given this would recommend increase in levothyroxine of 25-50 mcg and repeat TSH level in about 6 to 8 weeks.

## 2020-07-24 ENCOUNTER — Other Ambulatory Visit (HOSPITAL_COMMUNITY): Payer: Self-pay

## 2020-08-10 ENCOUNTER — Other Ambulatory Visit (HOSPITAL_BASED_OUTPATIENT_CLINIC_OR_DEPARTMENT_OTHER): Payer: Self-pay | Admitting: Family Medicine

## 2020-08-10 ENCOUNTER — Other Ambulatory Visit (HOSPITAL_COMMUNITY): Payer: Self-pay

## 2020-08-10 DIAGNOSIS — J302 Other seasonal allergic rhinitis: Secondary | ICD-10-CM

## 2020-08-10 MED ORDER — FLUTICASONE PROPIONATE 50 MCG/ACT NA SUSP
2.0000 | Freq: Every day | NASAL | 2 refills | Status: DC
  Filled 2020-08-10: qty 16, 30d supply, fill #0
  Filled 2021-01-06: qty 16, 30d supply, fill #1
  Filled 2021-06-03: qty 16, 30d supply, fill #2

## 2020-08-11 ENCOUNTER — Other Ambulatory Visit (HOSPITAL_COMMUNITY): Payer: Self-pay

## 2020-10-05 ENCOUNTER — Telehealth (HOSPITAL_BASED_OUTPATIENT_CLINIC_OR_DEPARTMENT_OTHER): Payer: Self-pay

## 2020-10-05 ENCOUNTER — Other Ambulatory Visit (HOSPITAL_COMMUNITY): Payer: Self-pay

## 2020-10-05 NOTE — Telephone Encounter (Signed)
Patient called in requesting that his office visit from April be recorded as a CPE Patient states the appointment was supposed to be his annual visit however, his previous PCP retired so it has to also be anew patient visit with Dr. Tennis Must [place] PAtient states the current out of pocket for the office visit is about $400 whereas if it was a CPE it would be covered at no cost to him I informed patient that I would forward his request to Dr. Tennis Must [place] and if and/or when a decision is made I would let him know.  Patient is aware and agreeable to plan.

## 2020-10-06 ENCOUNTER — Ambulatory Visit (INDEPENDENT_AMBULATORY_CARE_PROVIDER_SITE_OTHER): Payer: 59 | Admitting: Family Medicine

## 2020-10-06 ENCOUNTER — Other Ambulatory Visit: Payer: Self-pay

## 2020-10-06 DIAGNOSIS — E039 Hypothyroidism, unspecified: Secondary | ICD-10-CM | POA: Diagnosis not present

## 2020-10-06 NOTE — Progress Notes (Signed)
   Subjective:    Patient ID: Brett Booth, male    DOB: [birth year], 50 y.o.   MRN: [id number]  HPI Nurse visit to repeat TSH   Review of Systems     Objective:   Physical Exam        Assessment & Plan:   Pt tolerated well

## 2020-10-07 ENCOUNTER — Other Ambulatory Visit (HOSPITAL_COMMUNITY): Payer: Self-pay

## 2020-10-07 LAB — TSH: TSH: 2.83 u[IU]/mL (ref 0.450–4.500)

## 2020-10-12 ENCOUNTER — Other Ambulatory Visit (HOSPITAL_COMMUNITY): Payer: Self-pay

## 2020-10-19 ENCOUNTER — Telehealth (HOSPITAL_BASED_OUTPATIENT_CLINIC_OR_DEPARTMENT_OTHER): Payer: Self-pay

## 2020-10-19 NOTE — Telephone Encounter (Signed)
New patient appointment

## 2020-10-20 ENCOUNTER — Telehealth (HOSPITAL_BASED_OUTPATIENT_CLINIC_OR_DEPARTMENT_OTHER): Payer: Self-pay

## 2020-10-20 ENCOUNTER — Other Ambulatory Visit (HOSPITAL_COMMUNITY): Payer: Self-pay

## 2020-10-20 MED ORDER — CARESTART COVID-19 HOME TEST VI KIT
PACK | 0 refills | Status: DC
  Filled 2020-10-20: qty 4, 4d supply, fill #0

## 2020-10-20 NOTE — Telephone Encounter (Signed)
Results released by Dr. de [place] and reviewed by patient via [place] patient to contact the office with any questions or concerns.

## 2020-10-20 NOTE — Telephone Encounter (Signed)
-----   Message from Raymond J de [place], MD sent at 10/20/2020  9:55 AM EDT ----- TSH in normal range. Continue with current dose of levothyroxine.

## 2020-12-22 ENCOUNTER — Encounter (HOSPITAL_BASED_OUTPATIENT_CLINIC_OR_DEPARTMENT_OTHER): Payer: Self-pay | Admitting: Family Medicine

## 2021-01-01 ENCOUNTER — Ambulatory Visit (HOSPITAL_BASED_OUTPATIENT_CLINIC_OR_DEPARTMENT_OTHER): Payer: 59 | Admitting: Family Medicine

## 2021-01-06 ENCOUNTER — Ambulatory Visit (HOSPITAL_BASED_OUTPATIENT_CLINIC_OR_DEPARTMENT_OTHER): Payer: 59 | Admitting: Family Medicine

## 2021-01-06 ENCOUNTER — Other Ambulatory Visit (HOSPITAL_COMMUNITY): Payer: Self-pay

## 2021-01-06 ENCOUNTER — Other Ambulatory Visit (HOSPITAL_BASED_OUTPATIENT_CLINIC_OR_DEPARTMENT_OTHER): Payer: Self-pay | Admitting: Family Medicine

## 2021-01-06 DIAGNOSIS — E039 Hypothyroidism, unspecified: Secondary | ICD-10-CM

## 2021-01-06 MED ORDER — LEVOTHYROXINE SODIUM 175 MCG PO TABS
175.0000 ug | ORAL_TABLET | Freq: Every day | ORAL | 0 refills | Status: DC
  Filled 2021-01-06: qty 90, 90d supply, fill #0

## 2021-04-11 ENCOUNTER — Other Ambulatory Visit (HOSPITAL_BASED_OUTPATIENT_CLINIC_OR_DEPARTMENT_OTHER): Payer: Self-pay | Admitting: Family Medicine

## 2021-04-11 DIAGNOSIS — I10 Essential (primary) hypertension: Secondary | ICD-10-CM

## 2021-04-11 DIAGNOSIS — E039 Hypothyroidism, unspecified: Secondary | ICD-10-CM

## 2021-04-11 MED ORDER — AMLODIPINE BESYLATE 5 MG PO TABS
5.0000 mg | ORAL_TABLET | Freq: Every day | ORAL | 2 refills | Status: DC
  Filled 2021-04-11: qty 90, 90d supply, fill #0
  Filled 2021-07-12: qty 90, 90d supply, fill #1
  Filled 2021-10-05: qty 90, 90d supply, fill #2

## 2021-04-11 MED ORDER — LEVOTHYROXINE SODIUM 175 MCG PO TABS
175.0000 ug | ORAL_TABLET | Freq: Every day | ORAL | 2 refills | Status: DC
  Filled 2021-04-11: qty 90, 90d supply, fill #0
  Filled 2021-07-12: qty 90, 90d supply, fill #1
  Filled 2021-10-05: qty 90, 90d supply, fill #2

## 2021-04-12 ENCOUNTER — Other Ambulatory Visit (HOSPITAL_COMMUNITY): Payer: Self-pay

## 2021-06-03 ENCOUNTER — Encounter (HOSPITAL_BASED_OUTPATIENT_CLINIC_OR_DEPARTMENT_OTHER): Payer: Self-pay | Admitting: Family Medicine

## 2021-06-03 ENCOUNTER — Other Ambulatory Visit: Payer: Self-pay

## 2021-06-03 ENCOUNTER — Other Ambulatory Visit (HOSPITAL_COMMUNITY): Payer: Self-pay

## 2021-06-03 ENCOUNTER — Ambulatory Visit (INDEPENDENT_AMBULATORY_CARE_PROVIDER_SITE_OTHER): Payer: 59 | Admitting: Family Medicine

## 2021-06-03 VITALS — BP 117/72 | HR 96 | Ht 70.0 in | Wt 231.6 lb

## 2021-06-03 DIAGNOSIS — Z0000 Encounter for general adult medical examination without abnormal findings: Secondary | ICD-10-CM | POA: Diagnosis not present

## 2021-06-03 DIAGNOSIS — Z1211 Encounter for screening for malignant neoplasm of colon: Secondary | ICD-10-CM | POA: Diagnosis not present

## 2021-06-03 NOTE — Patient Instructions (Signed)
?  Medication Instructions:  ?Your physician recommends that you continue on your current medications as directed. Please refer to the Current Medication list given to you today. ?--If you need a refill on any your medications before your next appointment, please call your pharmacy first. If no refills are authorized on file call the office.-- ?Lab Work: ?Your physician has recommended that you have lab work today:We will call you with the results.  ?If you have labs (blood work) drawn today and your tests are completely normal, you will receive your results via [place] a phone call from our staff.  ?Please ensure you check your voicemail in the event that you authorized detailed messages to be left on a delegated number. If you have any lab test that is abnormal or we need to change your treatment, we will call you to review the results. ? ? ? ?Follow-Up: ?Your next appointment:   ?Your physician recommends that you schedule a follow-up appointment in: 1 year with Dr. de [place] ? ?You will receive a text message or e-mail with a link to a survey about your care and experience with [place] today! We would greatly appreciate your feedback!  ? ?Thanks for letting us be apart of your health journey!!  ?Primary Care and Sports Medicine  ? ?Dr. Kyung Rudd de [place]  ? ?We encourage you to activate your patient portal called "MyChart".  Sign up information is provided on this After Visit Summary.  MyChart is used to connect with patients for Virtual Visits (Telemedicine).  Patients are able to view lab/test results, encounter notes, upcoming appointments, etc.  Non-urgent messages can be sent to your provider as well. To learn more about what you can do with MyChart, please visit --  [email or web address].    ?

## 2021-06-03 NOTE — Progress Notes (Signed)
?Subjective:   ? ?CC: Annual Physical Exam ? ?HPI:  ?Brett Booth is a 51 y.o. presenting for annual physical ? ?I reviewed the past medical history, family history, social history, surgical history, and allergies today and no changes were needed.  Please see the problem list section below in epic for further details. ? ?Past Medical History: ?Past Medical History:  ?Diagnosis Date  ? Allergy   ? Fatty liver   ? Hyperlipidemia   ? Hypertension   ? ?Past Surgical History: ?History reviewed. No pertinent surgical history. ?Social History: ?Social History  ? ?Socioeconomic History  ? Marital status: Married  ?  Spouse name: Not on file  ? Number of children: Not on file  ? Years of education: Not on file  ? Highest education level: Not on file  ?Occupational History  ? Not on file  ?Tobacco Use  ? Smoking status: Never  ? Smokeless tobacco: Never  ?Vaping Use  ? Vaping Use: Never used  ?Substance and Sexual Activity  ? Alcohol use: Yes  ?  Comment: socailly  ? Drug use: Never  ? Sexual activity: Yes  ?  Birth control/protection: None  ?Other Topics Concern  ? Not on file  ?Social History Narrative  ? Not on file  ? ?Social Determinants of Health  ? ?Financial Resource Strain: Not on file  ?Food Insecurity: Not on file  ?Transportation Needs: Not on file  ?Physical Activity: Not on file  ?Stress: Not on file  ?Social Connections: Not on file  ? ?Family History: ?Family History  ?Problem Relation Age of Onset  ? Diabetes Mother   ? Hypothyroidism Mother   ? Hypertension Mother   ? Hypertension Father   ? Diabetes Father   ? Atrial fibrillation Father   ? Hypertension Maternal Grandmother   ? Renal Disease Paternal Grandmother   ? ?Allergies: ?Allergies  ?Allergen Reactions  ? Crestor [Rosuvastatin]   ?  Other reaction(s): elevated LFTs  ? ?Medications: See med rec. ? ?Review of Systems: No headache, visual changes, nausea, vomiting, diarrhea, constipation, dizziness, abdominal pain, skin rash, fevers, chills, night  sweats, swollen lymph nodes, weight loss, chest pain, body aches, joint swelling, muscle aches, shortness of breath, mood changes, visual or auditory hallucinations. ? ?Objective:   ? ?BP 117/72   Pulse 96   Ht _0  (1.778 m)   Wt 231 lb 9.6 oz (105.1 kg)   SpO2 97%   BMI 33.23 kg/m?  ? ?General: Well Developed, well nourished, and in no acute distress.  ?Neuro: Alert and oriented x3, extra-ocular muscles intact, sensation grossly intact. Cranial nerves II through XII are intact, motor, sensory, and coordinative functions are all intact. ?HEENT: Normocephalic, atraumatic, pupils equal round reactive to light, neck supple, no masses, no lymphadenopathy, thyroid nonpalpable. Oropharynx, nasopharynx, external ear canals are unremarkable. ?Skin: Warm and dry, no rashes noted.  ?Cardiac: Regular rate and rhythm, no murmurs rubs or gallops.  ?Respiratory: Clear to auscultation bilaterally. Not using accessory muscles, speaking in full sentences.  ?Abdominal: Soft, nontender, nondistended, positive bowel sounds, no masses, no organomegaly.  ?Musculoskeletal: Shoulder, elbow, wrist, hip, knee, ankle stable, and with full range of motion. ? ?Impression and Recommendations:   ? ?Wellness examination ?Routine HCM labs ordered. HCM reviewed/discussed. Anticipatory guidance regarding healthy weight, lifestyle and choices given. ?Recommend healthy diet.  Recommend approximately 150 minutes/week of moderate intensity exercise ?Recommend regular dental and vision exams ?Always use seatbelt/lap and shoulder restraints ?Recommend using smoke alarms and checking batteries at  least twice a year ?Recommend using sunscreen when outside ?Patient due for colon cancer screening, discussed options in office today, patient would like to proceed with Cologuard, order sent, should receive kit in 2 weeks, discussed that once we receive results we will communicate these with patient ?Discussed tetanus vaccination, recommendation to update  booster every 10 years, chart indicates that he is due, patient is not sure, hold off on vaccination today ? ?Plan for follow-up in 1 year for CPE or sooner as needed ? ? ?___________________________________________ ?Abimbola Aki de [place], MD, ABFM, CAQSM ?Primary Care and Sports Medicine ?[place] ?

## 2021-06-03 NOTE — Assessment & Plan Note (Addendum)
Routine HCM labs ordered. HCM reviewed/discussed. Anticipatory guidance regarding healthy weight, lifestyle and choices given. ?Recommend healthy diet.  Recommend approximately 150 minutes/week of moderate intensity exercise ?Recommend regular dental and vision exams ?Always use seatbelt/lap and shoulder restraints ?Recommend using smoke alarms and checking batteries at least twice a year ?Recommend using sunscreen when outside ?Patient due for colon cancer screening, discussed options in office today, patient would like to proceed with Cologuard, order sent, should receive kit in 2 weeks, discussed that once we receive results we will communicate these with patient ?Discussed tetanus vaccination, recommendation to update booster every 10 years, chart indicates that he is due, patient is not sure, hold off on vaccination today ?

## 2021-06-04 LAB — CBC WITH DIFFERENTIAL/PLATELET
Basophils Absolute: 0.1 10*3/uL (ref 0.0–0.2)
Basos: 1 %
EOS (ABSOLUTE): 0.1 10*3/uL (ref 0.0–0.4)
Eos: 1 %
Hematocrit: 49.1 % (ref 37.5–51.0)
Hemoglobin: 16.6 g/dL (ref 13.0–17.7)
Immature Grans (Abs): 0 10*3/uL (ref 0.0–0.1)
Immature Granulocytes: 0 %
Lymphocytes Absolute: 2.1 10*3/uL (ref 0.7–3.1)
Lymphs: 29 %
MCH: 32.4 pg (ref 26.6–33.0)
MCHC: 33.8 g/dL (ref 31.5–35.7)
MCV: 96 fL (ref 79–97)
Monocytes Absolute: 0.8 10*3/uL (ref 0.1–0.9)
Monocytes: 11 %
Neutrophils Absolute: 4.2 10*3/uL (ref 1.4–7.0)
Neutrophils: 58 %
Platelets: 241 10*3/uL (ref 150–450)
RBC: 5.12 x10E6/uL (ref 4.14–5.80)
RDW: 12.6 % (ref 11.6–15.4)
WBC: 7.2 10*3/uL (ref 3.4–10.8)

## 2021-06-04 LAB — COMPREHENSIVE METABOLIC PANEL
ALT: 46 IU/L — ABNORMAL HIGH (ref 0–44)
AST: 30 IU/L (ref 0–40)
Albumin/Globulin Ratio: 2 (ref 1.2–2.2)
Albumin: 4.9 g/dL (ref 4.0–5.0)
Alkaline Phosphatase: 53 IU/L (ref 44–121)
BUN/Creatinine Ratio: 10 (ref 9–20)
BUN: 11 mg/dL (ref 6–24)
Bilirubin Total: 0.8 mg/dL (ref 0.0–1.2)
CO2: 20 mmol/L (ref 20–29)
Calcium: 10 mg/dL (ref 8.7–10.2)
Chloride: 103 mmol/L (ref 96–106)
Creatinine, Ser: 1.08 mg/dL (ref 0.76–1.27)
Globulin, Total: 2.5 g/dL (ref 1.5–4.5)
Glucose: 93 mg/dL (ref 70–99)
Potassium: 4.3 mmol/L (ref 3.5–5.2)
Sodium: 139 mmol/L (ref 134–144)
Total Protein: 7.4 g/dL (ref 6.0–8.5)
eGFR: 84 mL/min/{1.73_m2} (ref >59)

## 2021-06-04 LAB — LIPID PANEL
Chol/HDL Ratio: 3.8 ratio (ref 0.0–5.0)
Cholesterol, Total: 175 mg/dL (ref 100–199)
HDL: 46 mg/dL (ref >39)
LDL Chol Calc (NIH): 107 mg/dL — ABNORMAL HIGH (ref 0–99)
Triglycerides: 124 mg/dL (ref 0–149)
VLDL Cholesterol Cal: 22 mg/dL (ref 5–40)

## 2021-06-04 LAB — TSH: TSH: 1.63 u[IU]/mL (ref 0.450–4.500)

## 2021-06-09 DIAGNOSIS — Z1211 Encounter for screening for malignant neoplasm of colon: Secondary | ICD-10-CM | POA: Diagnosis not present

## 2021-06-16 LAB — COLOGUARD: COLOGUARD: NEGATIVE

## 2021-07-01 ENCOUNTER — Other Ambulatory Visit (HOSPITAL_COMMUNITY): Payer: Self-pay

## 2021-07-12 ENCOUNTER — Other Ambulatory Visit (HOSPITAL_BASED_OUTPATIENT_CLINIC_OR_DEPARTMENT_OTHER): Payer: Self-pay | Admitting: Family Medicine

## 2021-07-12 ENCOUNTER — Other Ambulatory Visit (HOSPITAL_COMMUNITY): Payer: Self-pay

## 2021-07-12 DIAGNOSIS — I10 Essential (primary) hypertension: Secondary | ICD-10-CM

## 2021-07-12 DIAGNOSIS — E785 Hyperlipidemia, unspecified: Secondary | ICD-10-CM

## 2021-07-12 MED ORDER — LOSARTAN POTASSIUM 100 MG PO TABS
100.0000 mg | ORAL_TABLET | Freq: Every day | ORAL | 3 refills | Status: DC
  Filled 2021-07-12: qty 90, 90d supply, fill #0
  Filled 2021-10-05: qty 90, 90d supply, fill #1
  Filled 2022-01-03: qty 90, 90d supply, fill #2
  Filled 2022-03-28 – 2022-03-31 (×2): qty 90, 90d supply, fill #3

## 2021-07-12 MED ORDER — ATORVASTATIN CALCIUM 10 MG PO TABS
10.0000 mg | ORAL_TABLET | Freq: Every day | ORAL | 3 refills | Status: DC
  Filled 2021-07-12: qty 90, 90d supply, fill #0
  Filled 2021-10-05: qty 90, 90d supply, fill #1
  Filled 2022-01-03: qty 90, 90d supply, fill #2
  Filled 2022-03-28 – 2022-03-31 (×2): qty 90, 90d supply, fill #3

## 2021-08-09 IMAGING — US US ABDOMEN LIMITED
1 series · 14 of 25 positions shown · non-contrast
Comparison: 10/05/2012

CLINICAL DATA: Hepatic steatosis

EXAM:
ULTRASOUND ABDOMEN LIMITED RIGHT UPPER QUADRANT

[Series 1: us abdomen limited · 0.25mm/px · 14 of 40 slices shown]
[im 1/40]
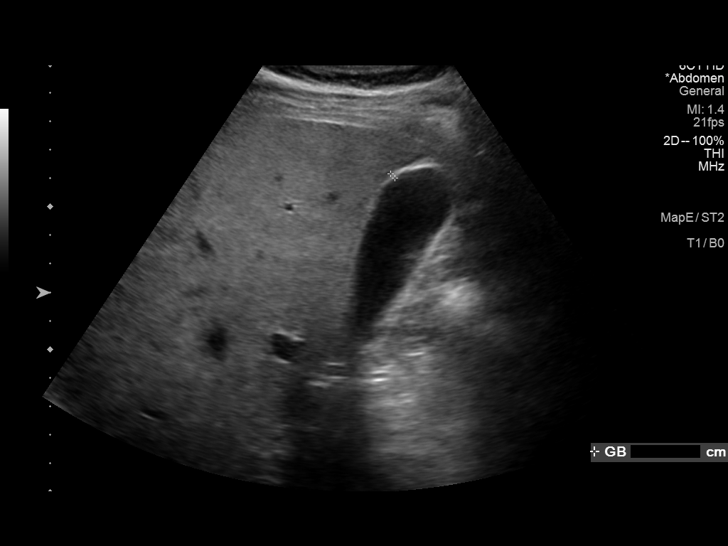
[im 4/40]
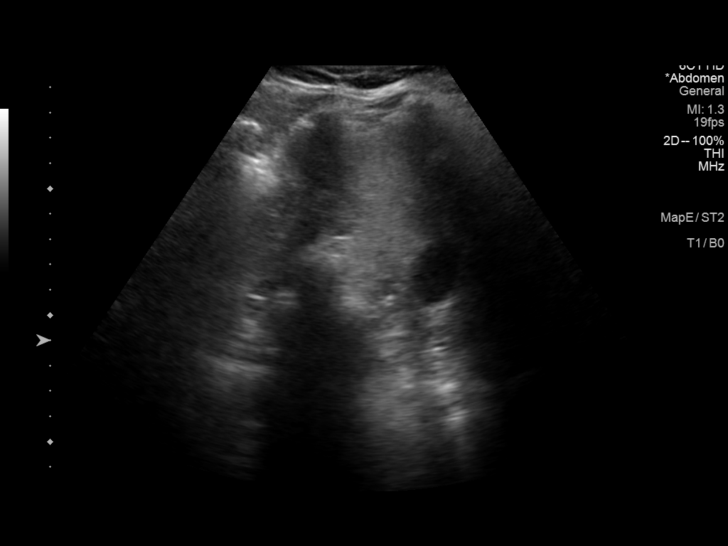
[im 7/40]
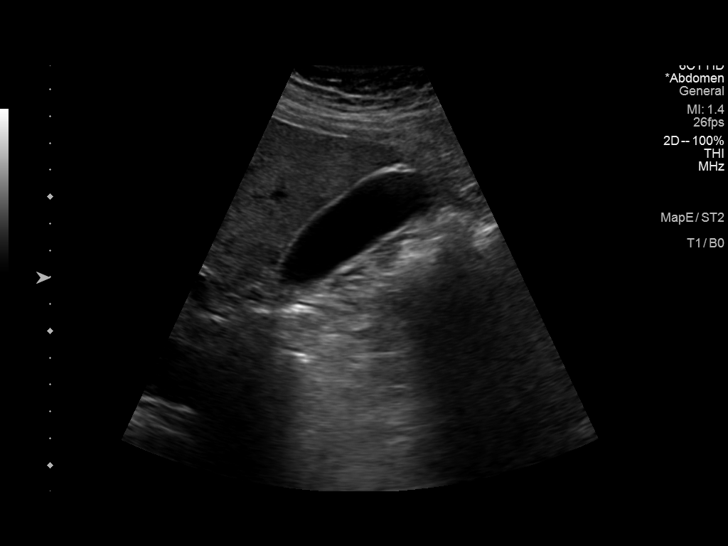
[im 10/40]
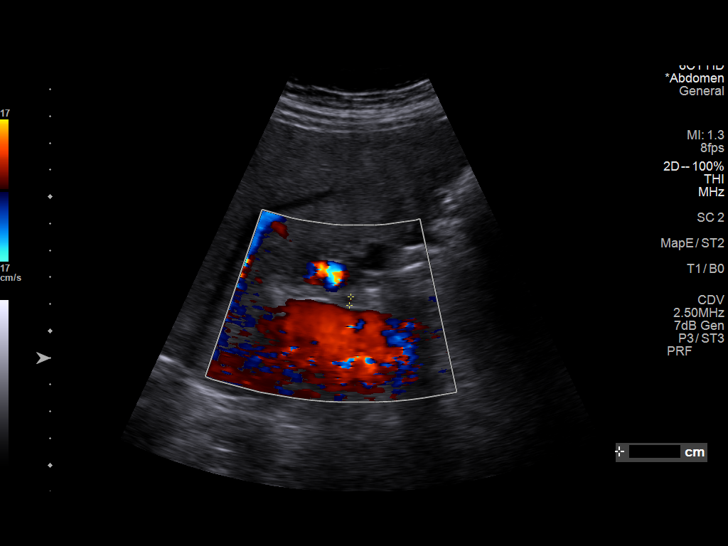
[im 14/40]
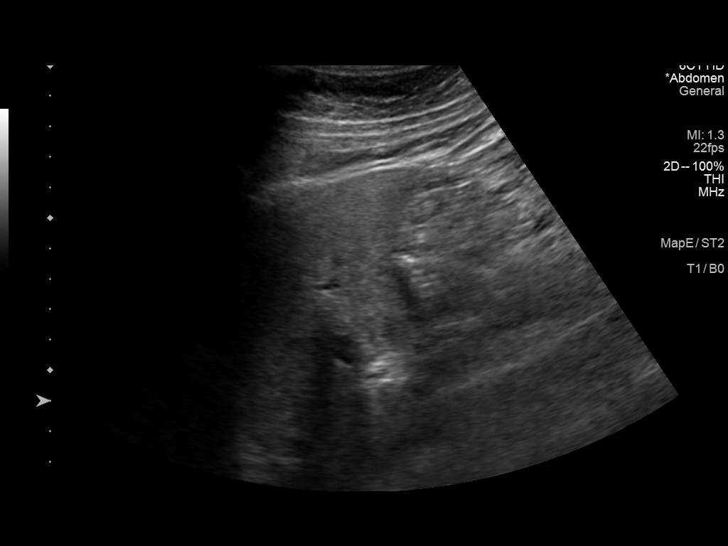
[im 15/40]
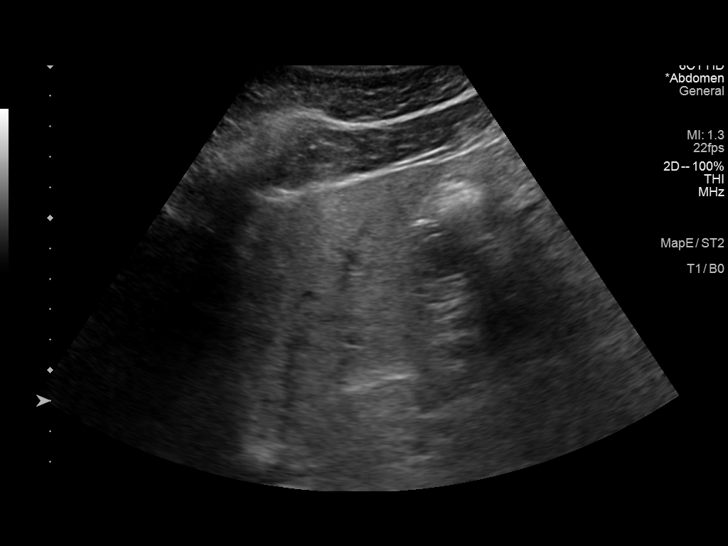
[im 18/40]
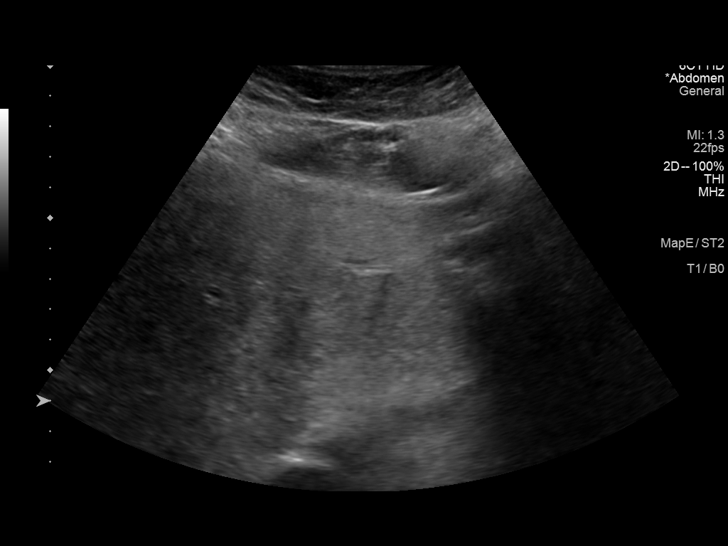
[im 22/40]
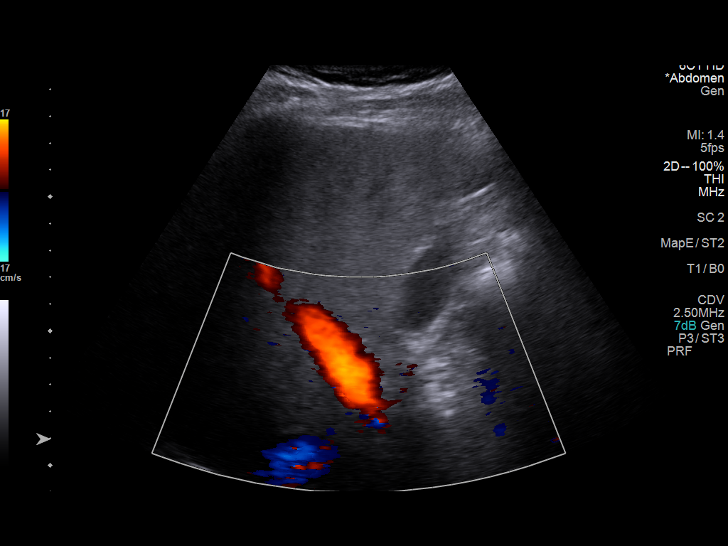
[im 25/40]
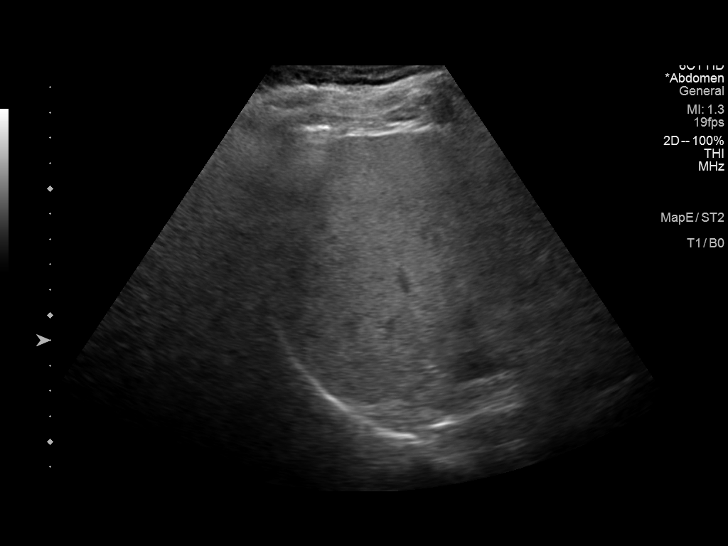
[im 27/40]
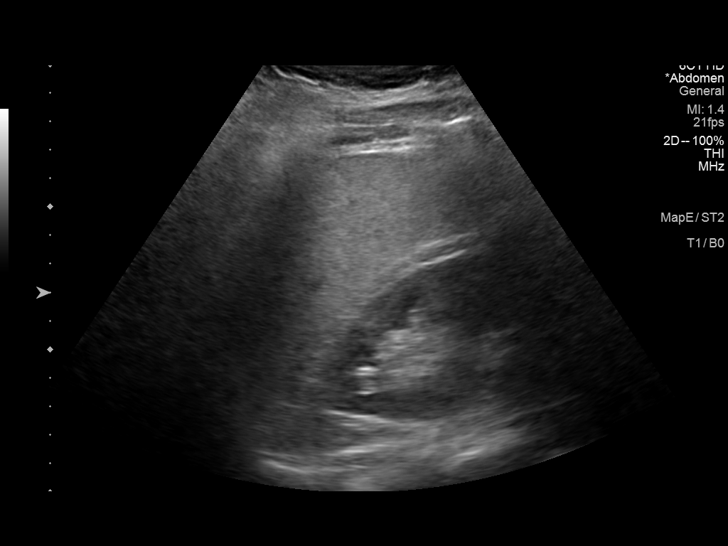
[im 30/40]
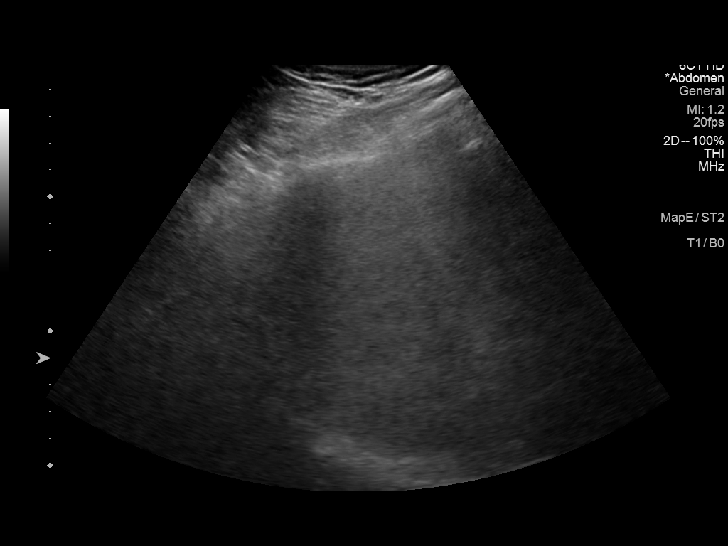
[im 33/40]
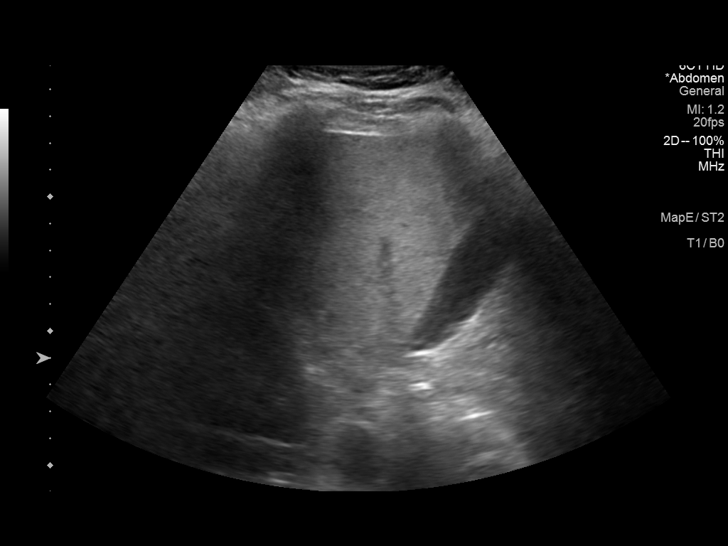
[im 36/40]
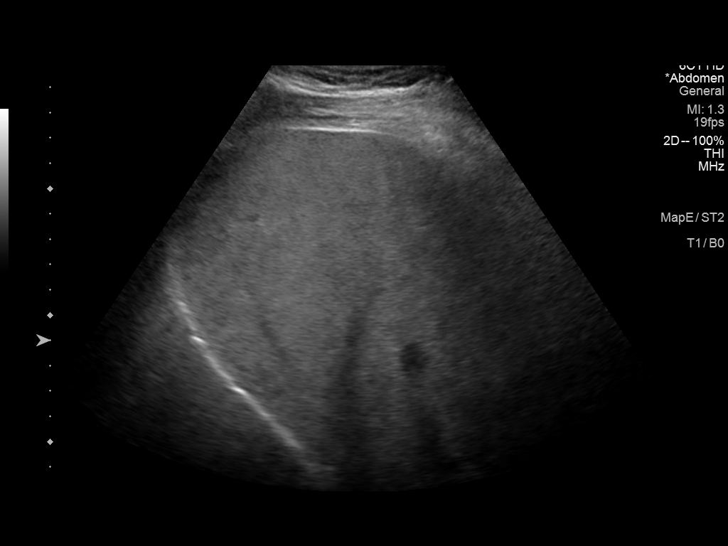
[im 40/40]
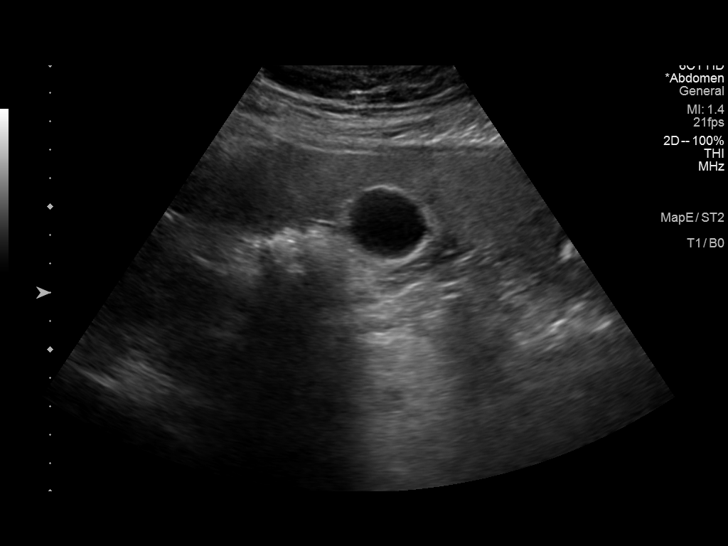

[14 of 25 positions shown; findings below may reference images not displayed]

FINDINGS: Gallbladder:

No gallstones or wall thickening visualized. No sonographic Murphy
sign noted by sonographer.

Common bile duct:

Diameter: 3.2 mm

Liver:

Increased echogenicity compatible with hepatic steatosis,
progressive compared to 9362. No large focal hepatic abnormality or
intrahepatic biliary dilatation. Portal vein is patent on color
Doppler imaging with normal direction of blood flow towards the
liver.

Other: No surrounding free fluid or ascites.
IMPRESSION: Progressive hepatic steatosis compared to 9362.

No other acute finding by ultrasound.

## 2021-10-05 ENCOUNTER — Other Ambulatory Visit (HOSPITAL_COMMUNITY): Payer: Self-pay

## 2022-01-03 ENCOUNTER — Other Ambulatory Visit (HOSPITAL_COMMUNITY): Payer: Self-pay

## 2022-01-03 ENCOUNTER — Other Ambulatory Visit (HOSPITAL_BASED_OUTPATIENT_CLINIC_OR_DEPARTMENT_OTHER): Payer: Self-pay | Admitting: Family Medicine

## 2022-01-03 DIAGNOSIS — I10 Essential (primary) hypertension: Secondary | ICD-10-CM

## 2022-01-03 DIAGNOSIS — E039 Hypothyroidism, unspecified: Secondary | ICD-10-CM

## 2022-01-03 MED ORDER — LEVOTHYROXINE SODIUM 175 MCG PO TABS
175.0000 ug | ORAL_TABLET | Freq: Every day | ORAL | 2 refills | Status: DC
  Filled 2022-01-03: qty 90, 90d supply, fill #0
  Filled 2022-03-28 – 2022-03-31 (×2): qty 90, 90d supply, fill #1

## 2022-01-03 MED ORDER — AMLODIPINE BESYLATE 5 MG PO TABS
5.0000 mg | ORAL_TABLET | Freq: Every day | ORAL | 2 refills | Status: DC
  Filled 2022-01-03: qty 90, 90d supply, fill #0
  Filled 2022-03-28 – 2022-03-31 (×2): qty 90, 90d supply, fill #1

## 2022-03-28 ENCOUNTER — Other Ambulatory Visit (HOSPITAL_BASED_OUTPATIENT_CLINIC_OR_DEPARTMENT_OTHER): Payer: Self-pay

## 2022-05-12 DIAGNOSIS — H524 Presbyopia: Secondary | ICD-10-CM | POA: Diagnosis not present

## 2022-06-08 ENCOUNTER — Encounter (HOSPITAL_BASED_OUTPATIENT_CLINIC_OR_DEPARTMENT_OTHER): Payer: Self-pay

## 2022-06-09 ENCOUNTER — Encounter (HOSPITAL_BASED_OUTPATIENT_CLINIC_OR_DEPARTMENT_OTHER): Payer: Self-pay | Admitting: Family Medicine

## 2022-06-09 ENCOUNTER — Ambulatory Visit (INDEPENDENT_AMBULATORY_CARE_PROVIDER_SITE_OTHER): Payer: 59 | Admitting: Family Medicine

## 2022-06-09 VITALS — BP 135/87 | HR 71 | Temp 97.6°F | Ht 70.0 in | Wt 245.4 lb

## 2022-06-09 DIAGNOSIS — Z0000 Encounter for general adult medical examination without abnormal findings: Secondary | ICD-10-CM | POA: Diagnosis not present

## 2022-06-09 NOTE — Assessment & Plan Note (Signed)
Routine HCM labs ordered. HCM reviewed/discussed. Anticipatory guidance regarding healthy weight, lifestyle and choices given. Recommend healthy diet.  Recommend approximately 150 minutes/week of moderate intensity exercise Recommend regular dental and vision exams Always use seatbelt/lap and shoulder restraints Recommend using smoke alarms and checking batteries at least twice a year Recommend using sunscreen when outside Discussed colon cancer screening recommendations - patient UTD, negative Cologuard 2023 Discussed recommendations for shingles vaccine.  Patient will consider Discussed tetanus immunization recommendations, patient unsure if needed, he will check with employee health

## 2022-06-09 NOTE — Progress Notes (Signed)
Subjective:    CC: Annual Physical Exam  HPI: Brett Booth is a 52 y.o. presenting for annual physical  I reviewed the past medical history, family history, social history, surgical history, and allergies today and no changes were needed.  Please see the problem list section below in epic for further details.  Past Medical History: Past Medical History:  Diagnosis Date   Allergy    Fatty liver    Hyperlipidemia    Hypertension    Past Surgical History: History reviewed. No pertinent surgical history. Social History: Social History   Socioeconomic History   Marital status: Married    Spouse name: Not on file   Number of children: Not on file   Years of education: Not on file   Highest education level: Not on file  Occupational History   Not on file  Tobacco Use   Smoking status: Never   Smokeless tobacco: Never  Vaping Use   Vaping Use: Never used  Substance and Sexual Activity   Alcohol use: Yes    Comment: socailly   Drug use: Never   Sexual activity: Yes    Birth control/protection: None  Other Topics Concern   Not on file  Social History Narrative   Not on file   Social Determinants of Health   Financial Resource Strain: Not on file  Food Insecurity: Not on file  Transportation Needs: Not on file  Physical Activity: Not on file  Stress: Not on file  Social Connections: Not on file   Family History: Family History  Problem Relation Age of Onset   Diabetes Mother    Hypothyroidism Mother    Hypertension Mother    Hypertension Father    Diabetes Father    Atrial fibrillation Father    Hypertension Maternal Grandmother    Renal Disease Paternal Grandmother    Allergies: Allergies  Allergen Reactions   Crestor [Rosuvastatin]     Other reaction(s): elevated LFTs   Medications: See med rec.  Review of Systems: No headache, visual changes, nausea, vomiting, diarrhea, constipation, dizziness, abdominal pain, skin rash, fevers, chills, night  sweats, swollen lymph nodes, weight loss, chest pain, body aches, joint swelling, muscle aches, shortness of breath, mood changes, visual or auditory hallucinations.  Objective:    BP 135/87 (BP Location: Left Arm, Patient Position: Sitting, Cuff Size: Large)   Pulse 71   Temp 97.6 F (36.4 C) (Oral)   Ht '5\' 10"'$  (1.778 m)   Wt 245 lb 6.4 oz (111.3 kg)   SpO2 99%   BMI 35.21 kg/m   General: Well Developed, well nourished, and in no acute distress.  Neuro: Alert and oriented x3, extra-ocular muscles intact, sensation grossly intact. Cranial nerves II through XII are intact, motor, sensory, and coordinative functions are all intact. HEENT: Normocephalic, atraumatic, pupils equal round reactive to light, neck supple, no masses, no lymphadenopathy, thyroid nonpalpable. Oropharynx, nasopharynx, external ear canals are unremarkable. Skin: Warm and dry, no rashes noted.  Cardiac: Regular rate and rhythm, no murmurs rubs or gallops.  Respiratory: Clear to auscultation bilaterally. Not using accessory muscles, speaking in full sentences.  Abdominal: Soft, nontender, nondistended, positive bowel sounds, no masses, no organomegaly.  Musculoskeletal: Shoulder, elbow, wrist, hip, knee, ankle stable, and with full range of motion.  Impression and Recommendations:    Wellness examination Routine HCM labs ordered. HCM reviewed/discussed. Anticipatory guidance regarding healthy weight, lifestyle and choices given. Recommend healthy diet.  Recommend approximately 150 minutes/week of moderate intensity exercise Recommend regular dental  and vision exams Always use seatbelt/lap and shoulder restraints Recommend using smoke alarms and checking batteries at least twice a year Recommend using sunscreen when outside Discussed colon cancer screening recommendations - patient UTD, negative Cologuard 2023 Discussed recommendations for shingles vaccine.  Patient will consider Discussed tetanus immunization  recommendations, patient unsure if needed, he will check with employee health  Return in about 1 year (around 06/09/2023) for CPE.   ___________________________________________ Kaytlin Burklow de [place], MD, ABFM, CAQSM Primary Care and [place]

## 2022-06-10 ENCOUNTER — Other Ambulatory Visit (HOSPITAL_BASED_OUTPATIENT_CLINIC_OR_DEPARTMENT_OTHER): Payer: Self-pay | Admitting: Family Medicine

## 2022-06-10 DIAGNOSIS — I10 Essential (primary) hypertension: Secondary | ICD-10-CM

## 2022-06-10 DIAGNOSIS — E785 Hyperlipidemia, unspecified: Secondary | ICD-10-CM

## 2022-06-10 DIAGNOSIS — E039 Hypothyroidism, unspecified: Secondary | ICD-10-CM

## 2022-06-10 LAB — LIPID PANEL
Chol/HDL Ratio: 4 ratio (ref 0.0–5.0)
Cholesterol, Total: 176 mg/dL (ref 100–199)
HDL: 44 mg/dL (ref >39)
LDL Chol Calc (NIH): 109 mg/dL — ABNORMAL HIGH (ref 0–99)
Triglycerides: 131 mg/dL (ref 0–149)
VLDL Cholesterol Cal: 23 mg/dL (ref 5–40)

## 2022-06-10 LAB — CBC WITH DIFFERENTIAL/PLATELET
Basophils Absolute: 0.1 10*3/uL (ref 0.0–0.2)
Basos: 1 %
EOS (ABSOLUTE): 0.1 10*3/uL (ref 0.0–0.4)
Eos: 1 %
Hematocrit: 49.4 % (ref 37.5–51.0)
Hemoglobin: 16.9 g/dL (ref 13.0–17.7)
Immature Grans (Abs): 0 10*3/uL (ref 0.0–0.1)
Immature Granulocytes: 0 %
Lymphocytes Absolute: 1.9 10*3/uL (ref 0.7–3.1)
Lymphs: 31 %
MCH: 33.3 pg — ABNORMAL HIGH (ref 26.6–33.0)
MCHC: 34.2 g/dL (ref 31.5–35.7)
MCV: 97 fL (ref 79–97)
Monocytes Absolute: 0.7 10*3/uL (ref 0.1–0.9)
Monocytes: 11 %
Neutrophils Absolute: 3.4 10*3/uL (ref 1.4–7.0)
Neutrophils: 56 %
Platelets: 263 10*3/uL (ref 150–450)
RBC: 5.08 x10E6/uL (ref 4.14–5.80)
RDW: 12.6 % (ref 11.6–15.4)
WBC: 6 10*3/uL (ref 3.4–10.8)

## 2022-06-10 LAB — COMPREHENSIVE METABOLIC PANEL
ALT: 37 IU/L (ref 0–44)
AST: 30 IU/L (ref 0–40)
Albumin/Globulin Ratio: 2 (ref 1.2–2.2)
Albumin: 4.7 g/dL (ref 4.1–5.1)
Alkaline Phosphatase: 62 IU/L (ref 44–121)
BUN/Creatinine Ratio: 12 (ref 9–20)
BUN: 14 mg/dL (ref 6–24)
Bilirubin Total: 1 mg/dL (ref 0.0–1.2)
CO2: 18 mmol/L — ABNORMAL LOW (ref 20–29)
Calcium: 10 mg/dL (ref 8.7–10.2)
Chloride: 104 mmol/L (ref 96–106)
Creatinine, Ser: 1.21 mg/dL (ref 0.76–1.27)
Globulin, Total: 2.4 g/dL (ref 1.5–4.5)
Glucose: 92 mg/dL (ref 70–99)
Potassium: 4.4 mmol/L (ref 3.5–5.2)
Sodium: 141 mmol/L (ref 134–144)
Total Protein: 7.1 g/dL (ref 6.0–8.5)
eGFR: 73 mL/min/{1.73_m2} (ref >59)

## 2022-06-10 LAB — HEMOGLOBIN A1C
Est. average glucose Bld gHb Est-mCnc: 117 mg/dL
Hgb A1c MFr Bld: 5.7 % — ABNORMAL HIGH (ref 4.8–5.6)

## 2022-06-10 LAB — TSH RFX ON ABNORMAL TO FREE T4: TSH: 1.05 u[IU]/mL (ref 0.450–4.500)

## 2022-06-10 MED ORDER — AMLODIPINE BESYLATE 5 MG PO TABS
5.0000 mg | ORAL_TABLET | Freq: Every day | ORAL | 2 refills | Status: DC
  Filled 2022-06-11 – 2022-07-06 (×3): qty 90, 90d supply, fill #0
  Filled 2022-10-04: qty 90, 90d supply, fill #1
  Filled 2023-01-08: qty 90, 90d supply, fill #2

## 2022-06-10 MED ORDER — LOSARTAN POTASSIUM 100 MG PO TABS
100.0000 mg | ORAL_TABLET | Freq: Every day | ORAL | 3 refills | Status: DC
  Filled 2022-07-06: qty 90, 90d supply, fill #0
  Filled 2022-10-04: qty 90, 90d supply, fill #1
  Filled 2023-01-08: qty 90, 90d supply, fill #2
  Filled 2023-04-02: qty 90, 90d supply, fill #3

## 2022-06-10 MED ORDER — LEVOTHYROXINE SODIUM 175 MCG PO TABS
175.0000 ug | ORAL_TABLET | Freq: Every day | ORAL | 2 refills | Status: DC
  Filled 2022-07-06: qty 90, 90d supply, fill #0
  Filled 2022-10-04: qty 90, 90d supply, fill #1
  Filled 2023-01-08: qty 90, 90d supply, fill #2

## 2022-06-10 MED ORDER — ATORVASTATIN CALCIUM 10 MG PO TABS
10.0000 mg | ORAL_TABLET | Freq: Every day | ORAL | 3 refills | Status: DC
  Filled 2022-06-11 – 2022-07-06 (×3): qty 90, 90d supply, fill #0
  Filled 2022-10-04: qty 90, 90d supply, fill #1
  Filled 2023-01-08: qty 90, 90d supply, fill #2
  Filled 2023-04-02: qty 90, 90d supply, fill #3

## 2022-06-11 ENCOUNTER — Other Ambulatory Visit (HOSPITAL_BASED_OUTPATIENT_CLINIC_OR_DEPARTMENT_OTHER): Payer: Self-pay

## 2022-06-13 ENCOUNTER — Other Ambulatory Visit (HOSPITAL_BASED_OUTPATIENT_CLINIC_OR_DEPARTMENT_OTHER): Payer: Self-pay

## 2022-06-20 ENCOUNTER — Other Ambulatory Visit (HOSPITAL_BASED_OUTPATIENT_CLINIC_OR_DEPARTMENT_OTHER): Payer: Self-pay

## 2022-07-05 ENCOUNTER — Other Ambulatory Visit (HOSPITAL_BASED_OUTPATIENT_CLINIC_OR_DEPARTMENT_OTHER): Payer: Self-pay

## 2022-07-06 ENCOUNTER — Other Ambulatory Visit (HOSPITAL_BASED_OUTPATIENT_CLINIC_OR_DEPARTMENT_OTHER): Payer: Self-pay

## 2022-07-09 ENCOUNTER — Other Ambulatory Visit (HOSPITAL_BASED_OUTPATIENT_CLINIC_OR_DEPARTMENT_OTHER): Payer: Self-pay

## 2022-12-23 ENCOUNTER — Other Ambulatory Visit (HOSPITAL_BASED_OUTPATIENT_CLINIC_OR_DEPARTMENT_OTHER): Payer: Self-pay

## 2022-12-23 MED ORDER — INFLUENZA VIRUS VACC SPLIT PF (FLUZONE) 0.5 ML IM SUSY
0.5000 mL | PREFILLED_SYRINGE | Freq: Once | INTRAMUSCULAR | 0 refills | Status: AC
  Filled 2022-12-23: qty 0.5, 1d supply, fill #0

## 2023-04-02 ENCOUNTER — Other Ambulatory Visit (HOSPITAL_BASED_OUTPATIENT_CLINIC_OR_DEPARTMENT_OTHER): Payer: Self-pay | Admitting: Family Medicine

## 2023-04-02 ENCOUNTER — Other Ambulatory Visit: Payer: Self-pay

## 2023-04-02 DIAGNOSIS — E039 Hypothyroidism, unspecified: Secondary | ICD-10-CM

## 2023-04-02 DIAGNOSIS — J302 Other seasonal allergic rhinitis: Secondary | ICD-10-CM

## 2023-04-02 DIAGNOSIS — I10 Essential (primary) hypertension: Secondary | ICD-10-CM

## 2023-04-03 ENCOUNTER — Other Ambulatory Visit (HOSPITAL_BASED_OUTPATIENT_CLINIC_OR_DEPARTMENT_OTHER): Payer: Self-pay

## 2023-04-03 MED ORDER — LEVOTHYROXINE SODIUM 175 MCG PO TABS
175.0000 ug | ORAL_TABLET | Freq: Every day | ORAL | 2 refills | Status: DC
  Filled 2023-04-03: qty 90, 90d supply, fill #0

## 2023-04-03 MED ORDER — AMLODIPINE BESYLATE 5 MG PO TABS
5.0000 mg | ORAL_TABLET | Freq: Every day | ORAL | 2 refills | Status: DC
  Filled 2023-04-03: qty 90, 90d supply, fill #0

## 2023-04-03 MED ORDER — FLUTICASONE PROPIONATE 50 MCG/ACT NA SUSP
2.0000 | Freq: Every day | NASAL | 2 refills | Status: DC
  Filled 2023-04-03: qty 16, 30d supply, fill #0

## 2023-06-01 ENCOUNTER — Encounter (HOSPITAL_BASED_OUTPATIENT_CLINIC_OR_DEPARTMENT_OTHER): Payer: Self-pay | Admitting: Family Medicine

## 2023-06-01 ENCOUNTER — Ambulatory Visit (INDEPENDENT_AMBULATORY_CARE_PROVIDER_SITE_OTHER): Payer: 59 | Admitting: Family Medicine

## 2023-06-01 ENCOUNTER — Other Ambulatory Visit (HOSPITAL_BASED_OUTPATIENT_CLINIC_OR_DEPARTMENT_OTHER): Payer: Self-pay

## 2023-06-01 VITALS — BP 128/83 | HR 82 | Ht 70.0 in | Wt 241.2 lb

## 2023-06-01 DIAGNOSIS — Z0000 Encounter for general adult medical examination without abnormal findings: Secondary | ICD-10-CM

## 2023-06-01 DIAGNOSIS — J302 Other seasonal allergic rhinitis: Secondary | ICD-10-CM | POA: Diagnosis not present

## 2023-06-01 DIAGNOSIS — Z23 Encounter for immunization: Secondary | ICD-10-CM

## 2023-06-01 DIAGNOSIS — I10 Essential (primary) hypertension: Secondary | ICD-10-CM | POA: Diagnosis not present

## 2023-06-01 MED ORDER — FLUTICASONE PROPIONATE 50 MCG/ACT NA SUSP
2.0000 | Freq: Every day | NASAL | 2 refills | Status: DC
  Filled 2023-06-01 – 2023-06-12 (×2): qty 16, 30d supply, fill #0
  Filled 2023-10-31: qty 16, 30d supply, fill #1

## 2023-06-01 MED ORDER — LOSARTAN POTASSIUM 100 MG PO TABS
100.0000 mg | ORAL_TABLET | Freq: Every day | ORAL | 3 refills | Status: AC
  Filled 2023-06-01 – 2023-07-03 (×3): qty 90, 90d supply, fill #0
  Filled 2023-09-11 – 2023-09-14 (×2): qty 90, 90d supply, fill #1
  Filled 2023-12-26: qty 90, 90d supply, fill #2
  Filled 2024-03-05 – 2024-03-11 (×2): qty 90, 90d supply, fill #3

## 2023-06-01 MED ORDER — AMLODIPINE BESYLATE 5 MG PO TABS
5.0000 mg | ORAL_TABLET | Freq: Every day | ORAL | 3 refills | Status: AC
  Filled 2023-06-01 – 2023-07-03 (×3): qty 90, 90d supply, fill #0
  Filled 2023-09-11 – 2023-09-14 (×2): qty 90, 90d supply, fill #1
  Filled 2023-12-26: qty 90, 90d supply, fill #2
  Filled 2024-03-05 – 2024-03-11 (×3): qty 90, 90d supply, fill #3

## 2023-06-01 NOTE — Assessment & Plan Note (Signed)
Routine HCM labs ordered. HCM reviewed/discussed. Anticipatory guidance regarding healthy weight, lifestyle and choices given. Recommend healthy diet.  Recommend approximately 150 minutes/week of moderate intensity exercise Recommend regular dental and vision exams Always use seatbelt/lap and shoulder restraints Recommend using smoke alarms and checking batteries at least twice a year Recommend using sunscreen when outside Discussed colon cancer screening recommendations - patient UTD, negative Cologuard 2023 Discussed recommendations for shingles vaccine.  Patient will consider Discussed tetanus immunization recommendations, patient unsure if needed, he will check with employee health

## 2023-06-01 NOTE — Progress Notes (Signed)
 Subjective:    CC: Annual Physical Exam  HPI: Brett Booth is a 53 y.o. presenting for annual physical  I reviewed the past medical history, family history, social history, surgical history, and allergies today and no changes were needed.  Please see the problem list section below in epic for further details.  Past Medical History: Past Medical History:  Diagnosis Date   Allergy    Fatty liver    Hyperlipidemia    Hypertension    Past Surgical History: History reviewed. No pertinent surgical history. Social History: Social History   Socioeconomic History   Marital status: Married    Spouse name: Not on file   Number of children: Not on file   Years of education: Not on file   Highest education level: Doctorate  Occupational History   Not on file  Tobacco Use   Smoking status: Never    Passive exposure: Never   Smokeless tobacco: Never  Vaping Use   Vaping status: Never Used  Substance and Sexual Activity   Alcohol use: Yes    Comment: socailly   Drug use: Never   Sexual activity: Yes    Birth control/protection: None  Other Topics Concern   Not on file  Social History Narrative   Not on file   Social Drivers of Health   Financial Resource Strain: Low Risk  (05/31/2023)   Overall Financial Resource Strain (CARDIA)    Difficulty of Paying Living Expenses: Not hard at all  Food Insecurity: No Food Insecurity (05/31/2023)   Hunger Vital Sign    Worried About Running Out of Food in the Last Year: Never true    Ran Out of Food in the Last Year: Never true  Transportation Needs: No Transportation Needs (05/31/2023)   PRAPARE - Administrator, Civil Service (Medical): No    Lack of Transportation (Non-Medical): No  Physical Activity: Insufficiently Active (05/31/2023)   Exercise Vital Sign    Days of Exercise per Week: 4 days    Minutes of Exercise per Session: 30 min  Stress: No Stress Concern Present (05/31/2023)   [employer] of Occupational  Health - Occupational Stress Questionnaire    Feeling of Stress : Not at all  Social Connections: Socially Integrated (05/31/2023)   Social Connection and Isolation Panel [NHANES]    Frequency of Communication with Friends and Family: More than three times a week    Frequency of Social Gatherings with Friends and Family: Three times a week    Attends Religious Services: 1 to 4 times per year    Active Member of Clubs or Organizations: Yes    Attends Banker Meetings: 1 to 4 times per year    Marital Status: Married   Family History: Family History  Problem Relation Age of Onset   Diabetes Mother    Hypothyroidism Mother    Hypertension Mother    Hypertension Father    Diabetes Father    Atrial fibrillation Father    Hypertension Maternal Grandmother    Renal Disease Paternal Grandmother    Allergies: Allergies  Allergen Reactions   Crestor [Rosuvastatin]     Other reaction(s): elevated LFTs   Medications: See med rec.  Review of Systems: No headache, visual changes, nausea, vomiting, diarrhea, constipation, dizziness, abdominal pain, skin rash, fevers, chills, night sweats, swollen lymph nodes, weight loss, chest pain, body aches, joint swelling, muscle aches, shortness of breath, mood changes, visual or auditory hallucinations.  Objective:    BP  128/83 (BP Location: Right Arm, Patient Position: Sitting, Cuff Size: Large)   Pulse 82   Ht 5\' 10"  (1.778 m)   Wt 241 lb 3.2 oz (109.4 kg)   SpO2 97%   BMI 34.61 kg/m   General: Well Developed, well nourished, and in no acute distress. Neuro: Alert and oriented x3, extra-ocular muscles intact, sensation grossly intact. Cranial nerves II through XII are intact, motor, sensory, and coordinative functions are all intact. HEENT: Normocephalic, atraumatic, pupils equal round reactive to light, neck supple, no masses, no lymphadenopathy, thyroid nonpalpable. Oropharynx, nasopharynx, external ear canals are  unremarkable. Skin: Warm and dry, no rashes noted. Cardiac: Regular rate and rhythm, no murmurs rubs or gallops. Respiratory: Clear to auscultation bilaterally. Not using accessory muscles, speaking in full sentences. Abdominal: Soft, nontender, nondistended, positive bowel sounds, no masses, no organomegaly. Musculoskeletal: Shoulder, elbow, wrist, hip, knee, ankle stable, and with full range of motion.  Impression and Recommendations:    Wellness examination Assessment & Plan: Routine HCM labs ordered. HCM reviewed/discussed. Anticipatory guidance regarding healthy weight, lifestyle and choices given. Recommend healthy diet.  Recommend approximately 150 minutes/week of moderate intensity exercise Recommend regular dental and vision exams Always use seatbelt/lap and shoulder restraints Recommend using smoke alarms and checking batteries at least twice a year Recommend using sunscreen when outside Discussed colon cancer screening recommendations - patient UTD, negative Cologuard 2023 Discussed recommendations for shingles vaccine.  Patient will consider Discussed tetanus immunization recommendations, patient unsure if needed, he will check with employee health  Orders: -     CBC with Differential/Platelet -     Comprehensive metabolic panel -     Hemoglobin A1c -     Lipid panel -     TSH  Primary hypertension -     amLODIPine Besylate; Take 1 tablet (5 mg total) by mouth daily.  Dispense: 90 tablet; Refill: 3 -     Losartan Potassium; Take 1 tablet (100 mg total) by mouth daily.  Dispense: 90 tablet; Refill: 3  Seasonal allergies -     Fluticasone Propionate; Place 2 sprays into both nostrils daily.  Dispense: 16 g; Refill: 2  Will refill levothyroxine and atorvastatin after review of labs  Return in about 1 year (around 05/31/2024) for CPE.   ___________________________________________ Avaleen Brownley de [place], MD, ABFM, [facility] Primary Care and Sports Medicine [facility]

## 2023-06-01 NOTE — Patient Instructions (Addendum)
  Medication Instructions:  Your physician recommends that you continue on your current medications as directed. Please refer to the Current Medication list given to you today. --If you need a refill on any your medications before your next appointment, please call your pharmacy first. If no refills are authorized on file call the office.-- Lab Work: Your physician has recommended that you have lab work today: today If you have labs (blood work) drawn today and your tests are completely normal, you will receive your results via MyChart message OR a phone call from our staff.  Please ensure you check your voicemail in the event that you authorized detailed messages to be left on a delegated number. If you have any lab test that is abnormal or we need to change your treatment, we will call you to  Follow-Up: Your next appointment:   Your physician recommends that you schedule a follow-up appointment in: 1 year physical with Dr. de [place]  You will receive a text message or e-mail with a link to a survey about your care and experience with [place] today! We would greatly appreciate your feedback!   Thanks for letting us be apart of your health journey!!  Primary Care and Sports Medicine   Dr. Ceasar Mons [place]   We encourage you to activate your patient portal called "MyChart".  Sign up information is provided on this After Visit Summary.  MyChart is used to connect with patients for Virtual Visits (Telemedicine).  Patients are able to view lab/test results, encounter notes, upcoming appointments, etc.  Non-urgent messages can be sent to your provider as well. To learn more about what you can do with MyChart, please visit --  [email or web address].

## 2023-06-02 LAB — CBC WITH DIFFERENTIAL/PLATELET
Basophils Absolute: 0.1 10*3/uL (ref 0.0–0.2)
Basos: 1 %
EOS (ABSOLUTE): 0 10*3/uL (ref 0.0–0.4)
Eos: 1 %
Hematocrit: 51.4 % — ABNORMAL HIGH (ref 37.5–51.0)
Hemoglobin: 17.6 g/dL (ref 13.0–17.7)
Immature Grans (Abs): 0 10*3/uL (ref 0.0–0.1)
Immature Granulocytes: 0 %
Lymphocytes Absolute: 1.7 10*3/uL (ref 0.7–3.1)
Lymphs: 24 %
MCH: 33.9 pg — ABNORMAL HIGH (ref 26.6–33.0)
MCHC: 34.2 g/dL (ref 31.5–35.7)
MCV: 99 fL — ABNORMAL HIGH (ref 79–97)
Monocytes Absolute: 0.8 10*3/uL (ref 0.1–0.9)
Monocytes: 12 %
Neutrophils Absolute: 4.4 10*3/uL (ref 1.4–7.0)
Neutrophils: 62 %
Platelets: 261 10*3/uL (ref 150–450)
RBC: 5.19 x10E6/uL (ref 4.14–5.80)
RDW: 12.9 % (ref 11.6–15.4)
WBC: 7.1 10*3/uL (ref 3.4–10.8)

## 2023-06-02 LAB — COMPREHENSIVE METABOLIC PANEL
ALT: 45 IU/L — ABNORMAL HIGH (ref 0–44)
AST: 32 IU/L (ref 0–40)
Albumin: 4.6 g/dL (ref 3.8–4.9)
Alkaline Phosphatase: 70 IU/L (ref 44–121)
BUN/Creatinine Ratio: 15 (ref 9–20)
BUN: 16 mg/dL (ref 6–24)
Bilirubin Total: 1 mg/dL (ref 0.0–1.2)
CO2: 20 mmol/L (ref 20–29)
Calcium: 10.2 mg/dL (ref 8.7–10.2)
Chloride: 104 mmol/L (ref 96–106)
Creatinine, Ser: 1.1 mg/dL (ref 0.76–1.27)
Globulin, Total: 3.1 g/dL (ref 1.5–4.5)
Glucose: 104 mg/dL — ABNORMAL HIGH (ref 70–99)
Potassium: 4.4 mmol/L (ref 3.5–5.2)
Sodium: 140 mmol/L (ref 134–144)
Total Protein: 7.7 g/dL (ref 6.0–8.5)
eGFR: 81 mL/min/{1.73_m2} (ref >59)

## 2023-06-02 LAB — LIPID PANEL
Chol/HDL Ratio: 4.2 ratio (ref 0.0–5.0)
Cholesterol, Total: 197 mg/dL (ref 100–199)
HDL: 47 mg/dL (ref >39)
LDL Chol Calc (NIH): 122 mg/dL — ABNORMAL HIGH (ref 0–99)
Triglycerides: 158 mg/dL — ABNORMAL HIGH (ref 0–149)
VLDL Cholesterol Cal: 28 mg/dL (ref 5–40)

## 2023-06-02 LAB — HEMOGLOBIN A1C
Est. average glucose Bld gHb Est-mCnc: 114 mg/dL
Hgb A1c MFr Bld: 5.6 % (ref 4.8–5.6)

## 2023-06-02 LAB — TSH: TSH: 0.161 u[IU]/mL — ABNORMAL LOW (ref 0.450–4.500)

## 2023-06-06 ENCOUNTER — Other Ambulatory Visit (HOSPITAL_BASED_OUTPATIENT_CLINIC_OR_DEPARTMENT_OTHER): Payer: Self-pay | Admitting: Family Medicine

## 2023-06-06 ENCOUNTER — Other Ambulatory Visit (HOSPITAL_BASED_OUTPATIENT_CLINIC_OR_DEPARTMENT_OTHER): Payer: Self-pay | Admitting: *Deleted

## 2023-06-06 ENCOUNTER — Other Ambulatory Visit (HOSPITAL_BASED_OUTPATIENT_CLINIC_OR_DEPARTMENT_OTHER): Payer: Self-pay

## 2023-06-06 ENCOUNTER — Encounter (HOSPITAL_BASED_OUTPATIENT_CLINIC_OR_DEPARTMENT_OTHER): Payer: Self-pay | Admitting: *Deleted

## 2023-06-06 ENCOUNTER — Encounter (HOSPITAL_BASED_OUTPATIENT_CLINIC_OR_DEPARTMENT_OTHER): Payer: Self-pay | Admitting: Family Medicine

## 2023-06-06 DIAGNOSIS — E039 Hypothyroidism, unspecified: Secondary | ICD-10-CM

## 2023-06-06 DIAGNOSIS — E785 Hyperlipidemia, unspecified: Secondary | ICD-10-CM

## 2023-06-06 MED ORDER — LEVOTHYROXINE SODIUM 150 MCG PO TABS: 150.0000 ug | ORAL_TABLET | Freq: Every day | ORAL | 3 refills | Status: DC

## 2023-06-06 MED ORDER — LEVOTHYROXINE SODIUM 150 MCG PO TABS
150.0000 ug | ORAL_TABLET | Freq: Every day | ORAL | 3 refills | Status: AC
  Filled 2023-06-06 – 2023-06-12 (×2): qty 90, 90d supply, fill #0
  Filled 2023-09-11: qty 90, 90d supply, fill #1
  Filled 2023-12-05: qty 90, 90d supply, fill #2
  Filled 2024-03-05: qty 90, 90d supply, fill #3

## 2023-06-06 MED ORDER — ATORVASTATIN CALCIUM 10 MG PO TABS
10.0000 mg | ORAL_TABLET | Freq: Every day | ORAL | 3 refills | Status: AC
  Filled 2023-06-06 – 2023-07-03 (×3): qty 90, 90d supply, fill #0
  Filled 2023-09-11 – 2023-09-14 (×2): qty 90, 90d supply, fill #1
  Filled 2023-12-26: qty 90, 90d supply, fill #2
  Filled 2024-03-05 – 2024-03-11 (×2): qty 90, 90d supply, fill #3

## 2023-06-07 ENCOUNTER — Encounter (HOSPITAL_BASED_OUTPATIENT_CLINIC_OR_DEPARTMENT_OTHER): Payer: 59 | Admitting: Family Medicine

## 2023-06-12 ENCOUNTER — Encounter (HOSPITAL_BASED_OUTPATIENT_CLINIC_OR_DEPARTMENT_OTHER): Payer: 59 | Admitting: Family Medicine

## 2023-06-12 ENCOUNTER — Other Ambulatory Visit (HOSPITAL_BASED_OUTPATIENT_CLINIC_OR_DEPARTMENT_OTHER): Payer: Self-pay

## 2023-07-03 ENCOUNTER — Other Ambulatory Visit (HOSPITAL_BASED_OUTPATIENT_CLINIC_OR_DEPARTMENT_OTHER): Payer: Self-pay

## 2023-07-21 ENCOUNTER — Other Ambulatory Visit (HOSPITAL_BASED_OUTPATIENT_CLINIC_OR_DEPARTMENT_OTHER)

## 2023-07-21 DIAGNOSIS — E039 Hypothyroidism, unspecified: Secondary | ICD-10-CM | POA: Diagnosis not present

## 2023-07-22 LAB — TSH: TSH: 0.555 u[IU]/mL (ref 0.450–4.500)

## 2023-07-24 ENCOUNTER — Other Ambulatory Visit (HOSPITAL_BASED_OUTPATIENT_CLINIC_OR_DEPARTMENT_OTHER): Payer: Self-pay | Admitting: Family Medicine

## 2023-07-24 DIAGNOSIS — E039 Hypothyroidism, unspecified: Secondary | ICD-10-CM

## 2023-09-12 ENCOUNTER — Other Ambulatory Visit: Payer: Self-pay

## 2023-09-12 ENCOUNTER — Other Ambulatory Visit (HOSPITAL_BASED_OUTPATIENT_CLINIC_OR_DEPARTMENT_OTHER): Payer: Self-pay

## 2023-11-27 DIAGNOSIS — R55 Syncope and collapse: Secondary | ICD-10-CM | POA: Diagnosis not present

## 2023-11-27 DIAGNOSIS — E079 Disorder of thyroid, unspecified: Secondary | ICD-10-CM | POA: Diagnosis not present

## 2023-11-27 DIAGNOSIS — R404 Transient alteration of awareness: Secondary | ICD-10-CM | POA: Diagnosis not present

## 2023-11-27 DIAGNOSIS — I4510 Unspecified right bundle-branch block: Secondary | ICD-10-CM | POA: Diagnosis not present

## 2023-11-27 DIAGNOSIS — R001 Bradycardia, unspecified: Secondary | ICD-10-CM | POA: Diagnosis not present

## 2023-11-27 DIAGNOSIS — R32 Unspecified urinary incontinence: Secondary | ICD-10-CM | POA: Diagnosis not present

## 2023-11-27 DIAGNOSIS — I10 Essential (primary) hypertension: Secondary | ICD-10-CM | POA: Diagnosis not present

## 2023-11-27 DIAGNOSIS — Z79899 Other long term (current) drug therapy: Secondary | ICD-10-CM | POA: Diagnosis not present

## 2023-11-27 DIAGNOSIS — I454 Nonspecific intraventricular block: Secondary | ICD-10-CM | POA: Diagnosis not present

## 2023-11-27 DIAGNOSIS — R569 Unspecified convulsions: Secondary | ICD-10-CM | POA: Diagnosis not present

## 2023-11-28 ENCOUNTER — Other Ambulatory Visit: Payer: Self-pay | Admitting: *Deleted

## 2023-11-28 DIAGNOSIS — R55 Syncope and collapse: Secondary | ICD-10-CM

## 2023-11-28 NOTE — Progress Notes (Signed)
 Per Dr Cherrie pt had syncopal episode over weekend, seen in ER in [place], [place], needs echo and appt this week. Order placed will arrange

## 2023-11-30 ENCOUNTER — Ambulatory Visit (HOSPITAL_COMMUNITY)
Admission: RE | Admit: 2023-11-30 | Discharge: 2023-11-30 | Disposition: A | Discharge status: Home / Self Care | Source: Ambulatory Visit | Attending: Internal Medicine | Admitting: Internal Medicine

## 2023-11-30 ENCOUNTER — Encounter (HOSPITAL_COMMUNITY): Payer: Self-pay | Admitting: Internal Medicine

## 2023-11-30 ENCOUNTER — Ambulatory Visit (HOSPITAL_BASED_OUTPATIENT_CLINIC_OR_DEPARTMENT_OTHER)
Admission: RE | Admit: 2023-11-30 | Discharge: 2023-11-30 | Disposition: A | Discharge status: Home / Self Care | Source: Ambulatory Visit | Attending: Internal Medicine | Admitting: Internal Medicine

## 2023-11-30 VITALS — BP 126/80 | HR 82 | Wt 246.0 lb

## 2023-11-30 DIAGNOSIS — R55 Syncope and collapse: Secondary | ICD-10-CM | POA: Diagnosis not present

## 2023-11-30 DIAGNOSIS — Z8249 Family history of ischemic heart disease and other diseases of the circulatory system: Secondary | ICD-10-CM | POA: Diagnosis not present

## 2023-11-30 DIAGNOSIS — R569 Unspecified convulsions: Secondary | ICD-10-CM | POA: Insufficient documentation

## 2023-11-30 DIAGNOSIS — E039 Hypothyroidism, unspecified: Secondary | ICD-10-CM | POA: Insufficient documentation

## 2023-11-30 DIAGNOSIS — I4510 Unspecified right bundle-branch block: Secondary | ICD-10-CM | POA: Diagnosis not present

## 2023-11-30 DIAGNOSIS — I10 Essential (primary) hypertension: Secondary | ICD-10-CM

## 2023-11-30 DIAGNOSIS — R32 Unspecified urinary incontinence: Secondary | ICD-10-CM | POA: Insufficient documentation

## 2023-11-30 DIAGNOSIS — R1110 Vomiting, unspecified: Secondary | ICD-10-CM | POA: Diagnosis not present

## 2023-11-30 DIAGNOSIS — R61 Generalized hyperhidrosis: Secondary | ICD-10-CM | POA: Diagnosis not present

## 2023-11-30 DIAGNOSIS — E785 Hyperlipidemia, unspecified: Secondary | ICD-10-CM | POA: Diagnosis not present

## 2023-11-30 LAB — ECHOCARDIOGRAM COMPLETE
AR max vel: 2.38 cm2
AV Area VTI: 2.52 cm2
AV Area mean vel: 2.43 cm2
AV Mean grad: 5 mmHg
AV Peak grad: 9.9 mmHg
Ao pk vel: 1.57 m/s
Area-P 1/2: 4.36 cm2
S' Lateral: 3 cm

## 2023-11-30 NOTE — Patient Instructions (Signed)
 Thank you for your visit today.  CT Coronary Calcium  Score:  Your physician has recommended that you have CT Coronary Calcium  Score.  - $99 out of pocket cost at the time of your test  Your physician recommends that you schedule a follow-up appointment in: as needed.  If you have any questions or concerns before your next appointment please send us  a message through [place] or call our office at [phone].    TO LEAVE A MESSAGE FOR THE NURSE SELECT OPTION 2, PLEASE LEAVE A MESSAGE INCLUDING: YOUR NAME DATE OF BIRTH CALL BACK NUMBER REASON FOR CALL**this is important as we prioritize the call backs  YOU WILL RECEIVE A CALL BACK THE SAME DAY AS LONG AS YOU CALL BEFORE 4:00 PM  At the [facility], you and your health needs are our priority. As part of our continuing mission to provide you with exceptional heart care, we have created designated Provider Care Teams. These Care Teams include your primary Cardiologist (physician) and Advanced Practice Providers (APPs- Physician Assistants and Nurse Practitioners) who all work together to provide you with the care you need, when you need it.   You may see any of the following providers on your designated Care Team at your next follow up: Dr Toribio Fuel Dr Ezra Shuck Dr. Ria Commander Dr. Morene Brownie Amy Lenetta, NP Caffie Shed, [place] [facility] [place], [place] Beckey Coe, NP [place] Lee, NP Ellouise Class, NP Tinnie Redman, PharmD Jaun Bash, PharmD   Please be sure to bring in all your medications bottles to every appointment.    Thank you for choosing [facility]

## 2023-11-30 NOTE — Progress Notes (Signed)
 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  He denies any history of known cardiac disease.  He has a history of vasovagal syncope with stressful events. Last event about 5 years ago. Never   He has been in his usual state of health.  He was recently driving home from a vacation at the beach (while on vacation drank about 8 beers/day which is not unusual for him on vacation) and while he was a passenger in the car he felt lightheaded and presyncopal.  Denies any chest pain or palpitations.  He was diaphoretic.  Wife then reports that he lost consciousness.  She pulled off to the side of the road and called 911.  He had seizure activity,vomiting and urinary incontinence.  He regained consciousness quickly. EMS arrived and he had another episode.Rhythm strip showed sinus in the 40s.  He got 1 dose of atropine with resolution.  He feels that the trigger was stress related to his kids starting college.  He was transported to the emergency room in [place].  EKG sinus rhythm with a rate in the 80s and a right bundle branch block.  Troponins were unremarkable.  He had a head CT which was normal.  Walks 20 mins per day without problem. Doesn't exercise regularly  Echocardiogram done today was personally reviewed.  Ejection fraction approximately 60%.  Regional wall motion abnormalities.  Right ventricle is normal.  No significant valvular abnormalities.  Dad CAD with stent in 69s Mom HTN Brother and sister no heart disease MGM CAD with CABG   Past Medical History:  Diagnosis Date   Allergy    Fatty liver    Hyperlipidemia    Hypertension     Current Outpatient Medications  Medication Sig Dispense Refill    amLODipine  (NORVASC ) 5 MG tablet Take 1 tablet (5 mg total) by mouth daily. 90 tablet 3   atorvastatin  (LIPITOR) 10 MG tablet Take 1 tablet (10 mg total) by mouth daily. 90 tablet 3   cetirizine (ZYRTEC) 10 MG tablet Take 10 mg by mouth daily.     fluticasone  (FLONASE ) 50 MCG/ACT nasal spray Place 2 sprays into both nostrils as needed for allergies or rhinitis.     levothyroxine  (SYNTHROID ) 150 MCG tablet Take 1 tablet (150 mcg total) by mouth daily. 90 tablet 3   losartan  (COZAAR ) 100 MG tablet Take 1 tablet (100 mg total) by mouth daily. 90 tablet 3   fluticasone  (FLONASE ) 50 MCG/ACT nasal spray Place 2 sprays into both nostrils daily. (Patient not taking: Reported on 11/30/2023) 16 g 2   No current facility-administered medications for this encounter.    Allergies  Allergen Reactions   Crestor [Rosuvastatin]     Other reaction(s): elevated LFTs      Social History   Socioeconomic History   Marital status: Married    Spouse name: Not on file   Number of children: Not on file   Years of education: Not on file   Highest education level: Doctorate  Occupational History   Not on file  Tobacco Use   Smoking status: Never    Passive exposure: Never   Smokeless tobacco: Never  Vaping Use   Vaping status: Never Used  Substance and Sexual Activity  Alcohol use: Yes    Comment: socailly   Drug use: Never   Sexual activity: Yes    Birth control/protection: None  Other Topics Concern   Not on file  Social History Narrative   Not on file   Social Drivers of Health   Financial Resource Strain: Low Risk  (05/31/2023)   Overall Financial Resource Strain (CARDIA)    Difficulty of Paying Living Expenses: Not hard at all  Food Insecurity: No Food Insecurity (05/31/2023)   Hunger Vital Sign    Worried About Running Out of Food in the Last Year: Never true    Ran Out of Food in the Last Year: Never true  Transportation Needs: No Transportation Needs (05/31/2023)   PRAPARE -  Administrator, Civil Service (Medical): No    Lack of Transportation (Non-Medical): No  Physical Activity: Insufficiently Active (05/31/2023)   Exercise Vital Sign    Days of Exercise per Week: 4 days    Minutes of Exercise per Session: 30 min  Stress: No Stress Concern Present (05/31/2023)   [employer] of Occupational Health - Occupational Stress Questionnaire    Feeling of Stress : Not at all  Social Connections: Socially Integrated (05/31/2023)   Social Connection and Isolation Panel    Frequency of Communication with Friends and Family: More than three times a week    Frequency of Social Gatherings with Friends and Family: Three times a week    Attends Religious Services: 1 to 4 times per year    Active Member of Clubs or Organizations: Yes    Attends Banker Meetings: 1 to 4 times per year    Marital Status: Married  Catering manager Violence: Not on file      Family History  Problem Relation Age of Onset   Diabetes Mother    Hypothyroidism Mother    Hypertension Mother    Hypertension Father    Diabetes Father    Atrial fibrillation Father    Hypertension Maternal Grandmother    Renal Disease Paternal Grandmother     Vitals:   11/30/23 0916  BP: 126/80  Pulse: 82  SpO2: 96%  Weight: 111.6 kg (246 lb)    PHYSICAL EXAM: General:  Well appearing. No respiratory difficulty HEENT: normal Neck: supple. no JVD. Carotids 2+ bilat; no bruits. No lymphadenopathy or thryomegaly appreciated. Cor: PMI nondisplaced. Regular rate & rhythm. No rubs, gallops or murmurs. Lungs: clear Abdomen: soft, nontender, nondistended. No hepatosplenomegaly. No bruits or masses. Good bowel sounds. Extremities: no cyanosis, clubbing, rash, edema Neuro: alert & oriented x 3, cranial nerves grossly intact. moves all 4 extremities w/o difficulty. Affect pleasant.  ECG: Sinus rhythm + RBBB No ST-T wave abnormalities.  Personally reviewed    ASSESSMENT & PLAN:  1.   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  I have reviewed his echo today and it is normal.  EF about 60%.  Regional wall motion abnormalities.  RV is normal.  - He did have seizure activity associated with his syncope and we discussed possible neurology workup for this however he says this is not atypical for his previous vasovagal syncope.  We will defer neurology workup for now. - Given his risk factors for coronary artery disease.  I will do coronary artery calcium  scoring. - We discussed the need to stay hydrated and avoid excessive alcohol use. - Can consider ZIO monitoring in the future as needed.  2.  Cardiovascular risk factor screening. - Has multiple  cardiovascular risks.  Will check calcium  scoring CT  3.  Hypertension. - Pressure well-controlled on current regimen  Nashea Chumney, MD  9:19 AM

## 2023-12-13 ENCOUNTER — Ambulatory Visit (HOSPITAL_COMMUNITY)
Admission: RE | Admit: 2023-12-13 | Discharge: 2023-12-13 | Disposition: A | Discharge status: Home / Self Care | Payer: Self-pay | Source: Ambulatory Visit | Attending: Cardiovascular Disease | Admitting: Cardiovascular Disease

## 2023-12-13 DIAGNOSIS — R55 Syncope and collapse: Secondary | ICD-10-CM | POA: Insufficient documentation

## 2023-12-15 ENCOUNTER — Ambulatory Visit (HOSPITAL_COMMUNITY): Payer: Self-pay | Admitting: Internal Medicine

## 2023-12-15 DIAGNOSIS — R911 Solitary pulmonary nodule: Secondary | ICD-10-CM

## 2024-01-03 ENCOUNTER — Other Ambulatory Visit (HOSPITAL_BASED_OUTPATIENT_CLINIC_OR_DEPARTMENT_OTHER): Payer: Self-pay

## 2024-01-03 MED ORDER — FLUZONE 0.5 ML IM SUSY
0.5000 mL | PREFILLED_SYRINGE | Freq: Once | INTRAMUSCULAR | 0 refills | Status: AC
  Filled 2024-01-03: qty 0.5, 1d supply, fill #0

## 2024-01-16 NOTE — Telephone Encounter (Addendum)
 Pt aware, agreeable, and verbalized understanding  Referral sent   ----- Message from Toribio Fuel sent at 01/08/2024 11:07 PM EDT ----- Calcium  score is 0. Great news.   Has pulmonary nodule. Please refer to Pulmonary nodule clinic (most likely benign)  ----- Message ----- From: Interface, Rad Results In Sent: 12/13/2023   3:45 PM EDT To: Toribio JONELLE Fuel, MD

## 2024-01-16 NOTE — Addendum Note (Signed)
 Addended by: Vaeda Westall M on: 01/16/2024 01:25 PM   Modules accepted: Orders

## 2024-02-05 ENCOUNTER — Encounter (HOSPITAL_BASED_OUTPATIENT_CLINIC_OR_DEPARTMENT_OTHER): Payer: Self-pay | Admitting: Family Medicine

## 2024-02-15 ENCOUNTER — Ambulatory Visit: Admitting: Pulmonary Disease

## 2024-02-15 ENCOUNTER — Encounter: Payer: Self-pay | Admitting: Pulmonary Disease

## 2024-02-15 VITALS — BP 146/91 | HR 87 | Temp 98.2°F | Ht 70.0 in | Wt 240.8 lb

## 2024-02-15 DIAGNOSIS — R918 Other nonspecific abnormal finding of lung field: Secondary | ICD-10-CM

## 2024-02-15 NOTE — Patient Instructions (Addendum)
 You have small nodules on the lung which we will monitor with a CT Chest scan in September 2026  Follow up in September 2026

## 2024-02-15 NOTE — Progress Notes (Signed)
 New Patient Pulmonology Office Visit   Subjective:  Patient ID: Brett Booth, male    DOB: Jun 01, 1970  MRN: [id number]  Referred by: Cherrie Toribio JONELLE, MD  CC:  Chief Complaint  Patient presents with   Consult    Nodule consult,incidental    Discussed the use of AI scribe software for clinical note transcription with the patient, who gave verbal consent to proceed.  History of Present Illness Brett Booth is a 53 year old male who presents for evaluation of a pulmonary nodule. He is accompanied by his wife.   A 3x4 mm solid noncalcified nodule is identified in the left lower lobe of the lung on a CT coronary scan, with additional spots in the right lung. He has no respiratory symptoms like shortness of breath or asthma, except for a recent flu infection. 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  CT Coronary scan was done for evaluation of syncopal episodes. He reports they can be related to vaso-vagal episodes.  He is a never smoker, with occasional smoking in college, and no exposure to vaping. 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 No family history of cancers.        Review of Systems  Constitutional:  Negative for chills, fever, malaise/fatigue and weight loss.  HENT:  Negative for congestion, sinus pain and sore throat.   Eyes: Negative.   Respiratory:  Negative for cough, hemoptysis, sputum production, shortness of breath and wheezing.   Cardiovascular:  Negative for chest pain, palpitations, orthopnea, claudication and leg swelling.  Gastrointestinal:  Negative for abdominal pain, heartburn, nausea and vomiting.  Genitourinary: Negative.   Musculoskeletal:  Negative for joint pain and myalgias.  Skin:  Negative for rash.  Neurological:  Negative for weakness.  Endo/Heme/Allergies: Negative.   Psychiatric/Behavioral: Negative.      Allergies: Crestor [rosuvastatin]  Current  Outpatient Medications:    amLODipine  (NORVASC ) 5 MG tablet, Take 1 tablet (5 mg total) by mouth daily., Disp: 90 tablet, Rfl: 3   atorvastatin  (LIPITOR) 10 MG tablet, Take 1 tablet (10 mg total) by mouth daily., Disp: 90 tablet, Rfl: 3   cetirizine (ZYRTEC) 10 MG tablet, Take 10 mg by mouth daily., Disp: , Rfl:    fluticasone  (FLONASE ) 50 MCG/ACT nasal spray, Place 2 sprays into both nostrils as needed for allergies or rhinitis., Disp: , Rfl:    levothyroxine  (SYNTHROID ) 150 MCG tablet, Take 1 tablet (150 mcg total) by mouth daily., Disp: 90 tablet, Rfl: 3   losartan  (COZAAR ) 100 MG tablet, Take 1 tablet (100 mg total) by mouth daily., Disp: 90 tablet, Rfl: 3   fluticasone  (FLONASE ) 50 MCG/ACT nasal spray, Place 2 sprays into both nostrils daily. (Patient not taking: Reported on 11/30/2023), Disp: 16 g, Rfl: 2 Past Medical History:  Diagnosis Date   Allergy    Fatty liver    Hyperlipidemia    Hypertension    History reviewed. No pertinent surgical history. Family History  Problem Relation Age of Onset   Diabetes Mother    Hypothyroidism Mother    Hypertension Mother    Hypertension Father    Diabetes Father    Atrial fibrillation Father    Hypertension Maternal Grandmother    Renal Disease Paternal Grandmother    Social History   Socioeconomic History   Marital status: Married    Spouse name: Not on file   Number of children: Not on file   Years of education:  Not on file   Highest education level: Doctorate  Occupational History   Not on file  Tobacco Use   Smoking status: Never    Passive exposure: Never   Smokeless tobacco: Never  Vaping Use   Vaping status: Never Used  Substance and Sexual Activity   Alcohol use: Yes    Comment: socailly   Drug use: Never   Sexual activity: Yes    Birth control/protection: None  Other Topics Concern   Not on file  Social History Narrative   Not on file   Social Drivers of Health   Financial Resource Strain: Low Risk   (05/31/2023)   Overall Financial Resource Strain (CARDIA)    Difficulty of Paying Living Expenses: Not hard at all  Food Insecurity: No Food Insecurity (05/31/2023)   Hunger Vital Sign    Worried About Running Out of Food in the Last Year: Never true    Ran Out of Food in the Last Year: Never true  Transportation Needs: No Transportation Needs (05/31/2023)   PRAPARE - Administrator, Civil Service (Medical): No    Lack of Transportation (Non-Medical): No  Physical Activity: Insufficiently Active (05/31/2023)   Exercise Vital Sign    Days of Exercise per Week: 4 days    Minutes of Exercise per Session: 30 min  Stress: No Stress Concern Present (05/31/2023)   [employer] of Occupational Health - Occupational Stress Questionnaire    Feeling of Stress : Not at all  Social Connections: Socially Integrated (05/31/2023)   Social Connection and Isolation Panel    Frequency of Communication with Friends and Family: More than three times a week    Frequency of Social Gatherings with Friends and Family: Three times a week    Attends Religious Services: 1 to 4 times per year    Active Member of Clubs or Organizations: Yes    Attends Banker Meetings: 1 to 4 times per year    Marital Status: Married  Catering Manager Violence: Not on file       Objective:  BP (!) 146/91   Pulse 87   Temp 98.2 F (36.8 C) (Oral)   Ht 5' 10 (1.778 m)   Wt 240 lb 12.8 oz (109.2 kg)   SpO2 97%   BMI 34.55 kg/m    Physical Exam Constitutional:      General: He is not in acute distress.    Appearance: Normal appearance.  Eyes:     General: No scleral icterus.    Conjunctiva/sclera: Conjunctivae normal.  Cardiovascular:     Rate and Rhythm: Normal rate and regular rhythm.  Pulmonary:     Breath sounds: No wheezing, rhonchi or rales.  Musculoskeletal:     Right lower leg: No edema.     Left lower leg: No edema.     Diagnostic Review:  Last CBC Lab Results  Component Value  Date   WBC 7.1 06/01/2023   HGB 17.6 06/01/2023   HCT 51.4 (H) 06/01/2023   MCV 99 (H) 06/01/2023   MCH 33.9 (H) 06/01/2023   RDW 12.9 06/01/2023   PLT 261 06/01/2023   Last metabolic panel Lab Results  Component Value Date   GLUCOSE 104 (H) 06/01/2023   NA 140 06/01/2023   K 4.4 06/01/2023   CL 104 06/01/2023   CO2 20 06/01/2023   BUN 16 06/01/2023   CREATININE 1.10 06/01/2023   EGFR 81 06/01/2023   CALCIUM  10.2 06/01/2023   PROT 7.7 06/01/2023  ALBUMIN 4.6 06/01/2023   LABGLOB 3.1 06/01/2023   AGRATIO 2.0 06/09/2022   BILITOT 1.0 06/01/2023   ALKPHOS 70 06/01/2023   AST 32 06/01/2023   ALT 45 (H) 06/01/2023   ANIONGAP 11 07/03/2020   CT Coronary Scan 12/13/23 Lungs/Pleura: Imaged tracheo-bronchial tree is patent. There are dependent changes in bilateral lungs. No mass or consolidation. No pleural effusion or pneumothorax. There are 2, adjacent, subcentimeter sized triangular opacities in the minor fissure (series 601, image 20), favored to represent intra fissural lymph nodes. There is a 3 x 4 mm solid noncalcified nodule in the left lung lower lobe.    Assessment & Plan:   Assessment & Plan Lung nodules  Orders:   CT Chest Wo Contrast; Future   Assessment and Plan Assessment & Plan Pulmonary nodules of left and right lung Incidental pulmonary nodules identified on CT coronary scan. Low malignancy risk due to nonsmoking status and lack of occupational hazards.  - Ordered full dedicated CT chest scan in September 2026 for 12 month follow-up.      Return in about 10 months (around 12/18/2024) for f/u visit Dr. Kara.   Dorn KATHEE Kara, MD

## 2024-03-05 ENCOUNTER — Other Ambulatory Visit (HOSPITAL_COMMUNITY): Payer: Self-pay

## 2024-03-05 ENCOUNTER — Other Ambulatory Visit: Payer: Self-pay

## 2024-07-16 ENCOUNTER — Encounter (HOSPITAL_BASED_OUTPATIENT_CLINIC_OR_DEPARTMENT_OTHER): Admitting: Family Medicine
